# Patient Record
Sex: Female | Born: 1985 | Race: Black or African American | Hispanic: No | Marital: Single | Smoking: Current every day smoker
Health system: Southern US, Community
[De-identification: ages and names within clinical notes are randomized; demographics above are authoritative.]

## PROBLEM LIST (undated history)

## (undated) DIAGNOSIS — R079 Chest pain, unspecified: Secondary | ICD-10-CM

## (undated) DIAGNOSIS — F32A Depression, unspecified: Secondary | ICD-10-CM

## (undated) DIAGNOSIS — M549 Dorsalgia, unspecified: Secondary | ICD-10-CM

## (undated) DIAGNOSIS — J45909 Unspecified asthma, uncomplicated: Secondary | ICD-10-CM

## (undated) DIAGNOSIS — E559 Vitamin D deficiency, unspecified: Secondary | ICD-10-CM

## (undated) DIAGNOSIS — I10 Essential (primary) hypertension: Secondary | ICD-10-CM

## (undated) DIAGNOSIS — F419 Anxiety disorder, unspecified: Secondary | ICD-10-CM

## (undated) DIAGNOSIS — M1990 Unspecified osteoarthritis, unspecified site: Secondary | ICD-10-CM

## (undated) DIAGNOSIS — M2550 Pain in unspecified joint: Secondary | ICD-10-CM

## (undated) DIAGNOSIS — R0602 Shortness of breath: Secondary | ICD-10-CM

## (undated) HISTORY — DX: Unspecified osteoarthritis, unspecified site: M19.90

## (undated) HISTORY — DX: Morbid (severe) obesity due to excess calories: E66.01

## (undated) HISTORY — DX: Shortness of breath: R06.02

## (undated) HISTORY — PX: OTHER SURGICAL HISTORY: SHX169

## (undated) HISTORY — DX: Pain in unspecified joint: M25.50

## (undated) HISTORY — DX: Dorsalgia, unspecified: M54.9

## (undated) HISTORY — DX: Depression, unspecified: F32.A

## (undated) HISTORY — DX: Anxiety disorder, unspecified: F41.9

## (undated) HISTORY — DX: Chest pain, unspecified: R07.9

## (undated) HISTORY — DX: Vitamin D deficiency, unspecified: E55.9

---

## 2018-03-06 ENCOUNTER — Other Ambulatory Visit: Payer: Self-pay

## 2018-12-09 ENCOUNTER — Encounter (HOSPITAL_COMMUNITY): Payer: Self-pay

## 2018-12-09 ENCOUNTER — Emergency Department (HOSPITAL_COMMUNITY): Payer: BLUE CROSS/BLUE SHIELD

## 2018-12-09 ENCOUNTER — Other Ambulatory Visit: Payer: Self-pay

## 2018-12-09 ENCOUNTER — Emergency Department (HOSPITAL_COMMUNITY)
Admission: EM | Admit: 2018-12-09 | Discharge: 2018-12-09 | Disposition: A | Discharge status: Home / Self Care | Payer: BLUE CROSS/BLUE SHIELD | Attending: Emergency Medicine | Admitting: Emergency Medicine

## 2018-12-09 DIAGNOSIS — J45909 Unspecified asthma, uncomplicated: Secondary | ICD-10-CM | POA: Insufficient documentation

## 2018-12-09 DIAGNOSIS — F17210 Nicotine dependence, cigarettes, uncomplicated: Secondary | ICD-10-CM | POA: Insufficient documentation

## 2018-12-09 DIAGNOSIS — Z79899 Other long term (current) drug therapy: Secondary | ICD-10-CM | POA: Diagnosis not present

## 2018-12-09 DIAGNOSIS — M5442 Lumbago with sciatica, left side: Secondary | ICD-10-CM | POA: Insufficient documentation

## 2018-12-09 DIAGNOSIS — I10 Essential (primary) hypertension: Secondary | ICD-10-CM | POA: Insufficient documentation

## 2018-12-09 DIAGNOSIS — M545 Low back pain: Secondary | ICD-10-CM | POA: Diagnosis present

## 2018-12-09 HISTORY — DX: Unspecified asthma, uncomplicated: J45.909

## 2018-12-09 HISTORY — DX: Essential (primary) hypertension: I10

## 2018-12-09 LAB — BASIC METABOLIC PANEL
Anion gap: 8 (ref 5–15)
BUN: 8 mg/dL (ref 6–20)
CO2: 25 mmol/L (ref 22–32)
Calcium: 9.4 mg/dL (ref 8.9–10.3)
Chloride: 105 mmol/L (ref 98–111)
Creatinine, Ser: 0.88 mg/dL (ref 0.44–1.00)
GFR calc Af Amer: >60 mL/min (ref >60)
GFR calc non Af Amer: >60 mL/min (ref >60)
Glucose, Bld: 100 mg/dL — ABNORMAL HIGH (ref 70–99)
Potassium: 4.1 mmol/L (ref 3.5–5.1)
Sodium: 138 mmol/L (ref 135–145)

## 2018-12-09 LAB — CBC
HCT: 44 % (ref 36.0–46.0)
Hemoglobin: 14.1 g/dL (ref 12.0–15.0)
MCH: 29.2 pg (ref 26.0–34.0)
MCHC: 32 g/dL (ref 30.0–36.0)
MCV: 91.1 fL (ref 80.0–100.0)
Platelets: 238 10*3/uL (ref 150–400)
RBC: 4.83 MIL/uL (ref 3.87–5.11)
RDW: 12.4 % (ref 11.5–15.5)
WBC: 7.8 10*3/uL (ref 4.0–10.5)
nRBC: 0 % (ref 0.0–0.2)

## 2018-12-09 LAB — CBG MONITORING, ED: Glucose-Capillary: 107 mg/dL — ABNORMAL HIGH (ref 70–99)

## 2018-12-09 MED ORDER — LISINOPRIL 5 MG PO TABS: 5.0000 mg | ORAL_TABLET | Freq: Every day | ORAL | 0 refills | Status: DC

## 2018-12-09 MED ORDER — METHOCARBAMOL 500 MG PO TABS: 500.0000 mg | ORAL_TABLET | Freq: Three times a day (TID) | ORAL | 0 refills | Status: AC | PRN

## 2018-12-09 MED ORDER — PREDNISONE 20 MG PO TABS: 40.0000 mg | ORAL_TABLET | Freq: Every day | ORAL | 0 refills | Status: DC
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
## 2018-12-09 NOTE — Discharge Instructions (Signed)
Follow-up with a primary care doctor.  Hopefully the steroids will help with the back pain.  Tylenol and Motrin may also help.  Your blood pressure will need tighter management also.

## 2019-09-14 ENCOUNTER — Encounter: Payer: Self-pay | Admitting: Obstetrics & Gynecology

## 2019-09-14 ENCOUNTER — Other Ambulatory Visit: Payer: Self-pay

## 2019-09-14 ENCOUNTER — Other Ambulatory Visit (HOSPITAL_COMMUNITY)
Admission: RE | Admit: 2019-09-14 | Discharge: 2019-09-14 | Disposition: A | Discharge status: Home / Self Care | Payer: 59 | Source: Ambulatory Visit | Attending: Obstetrics & Gynecology | Admitting: Obstetrics & Gynecology

## 2019-09-14 ENCOUNTER — Ambulatory Visit (INDEPENDENT_AMBULATORY_CARE_PROVIDER_SITE_OTHER): Payer: 59 | Admitting: Obstetrics & Gynecology

## 2019-09-14 VITALS — BP 158/101 | HR 72 | Ht 70.0 in | Wt 332.1 lb

## 2019-09-14 DIAGNOSIS — Z01419 Encounter for gynecological examination (general) (routine) without abnormal findings: Secondary | ICD-10-CM | POA: Diagnosis present

## 2019-09-14 DIAGNOSIS — Z30431 Encounter for routine checking of intrauterine contraceptive device: Secondary | ICD-10-CM

## 2019-09-14 NOTE — Patient Instructions (Signed)
Preventive Care 21-33 Years Old, Female Preventive care refers to visits with your health care provider and lifestyle choices that can promote health and wellness. This includes:  A yearly physical exam. This may also be called an annual well check.  Regular dental visits and eye exams.  Immunizations.  Screening for certain conditions.  Healthy lifestyle choices, such as eating a healthy diet, getting regular exercise, not using drugs or products that contain nicotine and tobacco, and limiting alcohol use. What can I expect for my preventive care visit? Physical exam Your health care provider will check your:  Height and weight. This may be used to calculate body mass index (BMI), which tells if you are at a healthy weight.  Heart rate and blood pressure.  Skin for abnormal spots. Counseling Your health care provider may ask you questions about your:  Alcohol, tobacco, and drug use.  Emotional well-being.  Home and relationship well-being.  Sexual activity.  Eating habits.  Work and work environment.  Method of birth control.  Menstrual cycle.  Pregnancy history. What immunizations do I need?  Influenza (flu) vaccine  This is recommended every year. Tetanus, diphtheria, and pertussis (Tdap) vaccine  You may need a Td booster every 10 years. Varicella (chickenpox) vaccine  You may need this if you have not been vaccinated. Human papillomavirus (HPV) vaccine  If recommended by your health care provider, you may need three doses over 6 months. Measles, mumps, and rubella (MMR) vaccine  You may need at least one dose of MMR. You may also need a second dose. Meningococcal conjugate (MenACWY) vaccine  One dose is recommended if you are age 19-21 years and a first-year college student living in a residence hall, or if you have one of several medical conditions. You may also need additional booster doses. Pneumococcal conjugate (PCV13) vaccine  You may need  this if you have certain conditions and were not previously vaccinated. Pneumococcal polysaccharide (PPSV23) vaccine  You may need one or two doses if you smoke cigarettes or if you have certain conditions. Hepatitis A vaccine  You may need this if you have certain conditions or if you travel or work in places where you may be exposed to hepatitis A. Hepatitis B vaccine  You may need this if you have certain conditions or if you travel or work in places where you may be exposed to hepatitis B. Haemophilus influenzae type b (Hib) vaccine  You may need this if you have certain conditions. You may receive vaccines as individual doses or as more than one vaccine together in one shot (combination vaccines). Talk with your health care provider about the risks and benefits of combination vaccines. What tests do I need?  Blood tests  Lipid and cholesterol levels. These may be checked every 5 years starting at age 20.  Hepatitis C test.  Hepatitis B test. Screening  Diabetes screening. This is done by checking your blood sugar (glucose) after you have not eaten for a while (fasting).  Sexually transmitted disease (STD) testing.  BRCA-related cancer screening. This may be done if you have a family history of breast, ovarian, tubal, or peritoneal cancers.  Pelvic exam and Pap test. This may be done every 3 years starting at age 21. Starting at age 30, this may be done every 5 years if you have a Pap test in combination with an HPV test. Talk with your health care provider about your test results, treatment options, and if necessary, the need for more tests.   Follow these instructions at home: Eating and drinking   Eat a diet that includes fresh fruits and vegetables, whole grains, lean protein, and low-fat dairy.  Take vitamin and mineral supplements as recommended by your health care provider.  Do not drink alcohol if: ? Your health care provider tells you not to drink. ? You are  pregnant, may be pregnant, or are planning to become pregnant.  If you drink alcohol: ? Limit how much you have to 0-1 drink a day. ? Be aware of how much alcohol is in your drink. In the [place] one drink equals one 12 oz bottle of beer (355 mL), one 5 oz glass of wine (148 mL), or one 1 oz glass of hard liquor (44 mL). Lifestyle  Take daily care of your teeth and gums.  Stay active. Exercise for at least 30 minutes on 5 or more days each week.  Do not use any products that contain nicotine or tobacco, such as cigarettes, e-cigarettes, and chewing tobacco. If you need help quitting, ask your health care provider.  If you are sexually active, practice safe sex. Use a condom or other form of birth control (contraception) in order to prevent pregnancy and STIs (sexually transmitted infections). If you plan to become pregnant, see your health care provider for a preconception visit. What's next?  Visit your health care provider once a year for a well check visit.  Ask your health care provider how often you should have your eyes and teeth checked.  Stay up to date on all vaccines. This information is not intended to replace advice given to you by your health care provider. Make sure you discuss any questions you have with your health care provider. Document Revised: 09/30/2017 Document Reviewed: 09/30/2017 Elsevier Patient Education  2020 [employer].

## 2019-09-14 NOTE — Progress Notes (Signed)
GYNECOLOGY ANNUAL PREVENTATIVE CARE ENCOUNTER NOTE  History:     Stacy Stafford is a 34 y.o. female here for a routine annual gynecologic exam.  Current complaints: wants to make sure IUD is in place, also desires annual STI screen.   Denies abnormal vaginal bleeding, discharge, pelvic pain, problems with intercourse or other gynecologic concerns.    Gynecologic History No LMP recorded. (Menstrual status: IUD). Contraception: Mirena IUD placed 2017 Last Pap: 02/2018. Results were: normal with negative HPV  Past Medical History:  Diagnosis Date  . Asthma   . Hypertension     History reviewed. No pertinent surgical history.  Current Outpatient Medications on File Prior to Visit  Medication Sig Dispense Refill  . acetaminophen (TYLENOL) 500 MG tablet Take 500 mg by mouth every 6 (six) hours as needed.    Marland Kitchen ibuprofen (ADVIL) 100 MG/5ML suspension Take 200 mg by mouth every 4 (four) hours as needed.    Marland Kitchen amLODipine (NORVASC) 5 MG tablet Take 5 mg by mouth daily. (Patient not taking: Reported on 09/14/2019)    . lisinopril (ZESTRIL) 5 MG tablet Take 1 tablet (5 mg total) by mouth daily. (Patient not taking: Reported on 09/14/2019) 30 tablet 0  . methocarbamol (ROBAXIN) 500 MG tablet Take 1 tablet (500 mg total) by mouth every 8 (eight) hours as needed for muscle spasms. (Patient not taking: Reported on 09/14/2019) 10 tablet 0  . predniSONE (DELTASONE) 20 MG tablet Take 2 tablets (40 mg total) by mouth daily. (Patient not taking: Reported on 09/14/2019) 6 tablet 0   No current facility-administered medications on file prior to visit.    No Known Allergies  Social History:  reports that she has been smoking cigarettes. She has been smoking about 0.50 packs per day. She has never used smokeless tobacco. She reports current alcohol use of about 1.0 standard drink of alcohol per week. She reports that she does not use drugs.  Family History  Family history unknown: Yes    The following  portions of the patient's history were reviewed and updated as appropriate: allergies, current medications, past family history, past medical history, past social history, past surgical history and problem list.  Review of Systems Pertinent items noted in HPI and remainder of comprehensive ROS otherwise negative.  Physical Exam:  BP (!) 158/101   Pulse 72   Ht 5\' 10"  (1.778 m)   Wt (!) 332 lb 1.6 oz (150.6 kg)   BMI 47.65 kg/m  CONSTITUTIONAL: Well-developed, well-nourished female in no acute distress.  HENT:  Normocephalic, atraumatic, External right and left ear normal. Oropharynx is clear and moist EYES: Conjunctivae and EOM are normal. Pupils are equal, round, and reactive to light. No scleral icterus.  NECK: Normal range of motion, supple, no masses.  Normal thyroid.  SKIN: Skin is warm and dry. No rash noted. Not diaphoretic. No erythema. No pallor. MUSCULOSKELETAL: Normal range of motion. No tenderness.  No cyanosis, clubbing, or edema. NEUROLOGIC: Alert and oriented to person, place, and time. Normal reflexes, muscle tone coordination. No cranial nerve deficit noted. PSYCHIATRIC: Normal mood and affect. Normal behavior. Normal judgment and thought content. CARDIOVASCULAR: Normal heart rate noted, regular rhythm RESPIRATORY: Clear to auscultation bilaterally. Effort and breath sounds normal, no problems with respiration noted. BREASTS: Symmetric in size. No masses, tenderness, skin changes, nipple drainage, or lymphadenopathy bilaterally.  Performed in the presence of a chaperone. ABDOMEN: Soft, obese, no distention appreciated.  No tenderness, rebound or guarding.  PELVIC: Normal appearing external genitalia  and urethral meatus; normal appearing vaginal mucosa and cervix. IUD strings seen.  Normal appearing discharge.  Unable to palpate uterus or adnexa secondary to habitus.  Performed in the presence of a chaperone.    Assessment and Plan:    1. IUD check up IUD in place, also  noted to be in place during CT scan done in 01/2019 for other indications. Patient reassured. Due for removal in 2023.  2. Well woman exam with routine gynecological exam Up to date with pap, not done today. STI screening done, will follow up results and manage accordingly. - HIV antibody (with reflex) - Hepatitis C Antibody - Cervicovaginal ancillary only - Hepatitis B surface antigen - RPR Routine preventative health maintenance measures emphasized.  Told to follow up with PCP for HTN. Please refer to After Visit Summary for other counseling recommendations.      Jaynie Collins, MD, FACOG Obstetrician & Gynecologist, [facility] for [employer], [facility]

## 2019-09-15 LAB — CERVICOVAGINAL ANCILLARY ONLY
Chlamydia: NEGATIVE
Comment: NEGATIVE
Comment: NEGATIVE
Comment: NORMAL
Neisseria Gonorrhea: NEGATIVE
Trichomonas: NEGATIVE

## 2019-09-15 LAB — HEPATITIS B SURFACE ANTIGEN: Hepatitis B Surface Ag: NEGATIVE

## 2019-09-15 LAB — HIV ANTIBODY (ROUTINE TESTING W REFLEX): HIV Screen 4th Generation wRfx: NONREACTIVE

## 2019-09-15 LAB — HEPATITIS C ANTIBODY: Hep C Virus Ab: <0.1 s/co ratio (ref 0.0–0.9)

## 2019-09-15 LAB — RPR: RPR Ser Ql: NONREACTIVE

## 2019-09-25 ENCOUNTER — Other Ambulatory Visit: Payer: Self-pay | Admitting: Orthopedic Surgery

## 2019-09-25 DIAGNOSIS — M545 Low back pain, unspecified: Secondary | ICD-10-CM

## 2019-10-10 ENCOUNTER — Other Ambulatory Visit: Payer: Self-pay

## 2019-10-10 ENCOUNTER — Encounter (INDEPENDENT_AMBULATORY_CARE_PROVIDER_SITE_OTHER): Payer: Self-pay | Admitting: Family Medicine

## 2019-10-10 ENCOUNTER — Ambulatory Visit (INDEPENDENT_AMBULATORY_CARE_PROVIDER_SITE_OTHER): Payer: 59 | Admitting: Family Medicine

## 2019-10-10 ENCOUNTER — Encounter (INDEPENDENT_AMBULATORY_CARE_PROVIDER_SITE_OTHER): Payer: Self-pay | Admitting: Psychology

## 2019-10-10 VITALS — BP 128/91 | HR 87 | Temp 97.9°F | Ht 70.0 in | Wt 270.0 lb

## 2019-10-10 DIAGNOSIS — E559 Vitamin D deficiency, unspecified: Secondary | ICD-10-CM

## 2019-10-10 DIAGNOSIS — Z91199 Patient's noncompliance with other medical treatment and regimen due to unspecified reason: Secondary | ICD-10-CM

## 2019-10-10 DIAGNOSIS — Z6838 Body mass index (BMI) 38.0-38.9, adult: Secondary | ICD-10-CM

## 2019-10-10 DIAGNOSIS — F329 Major depressive disorder, single episode, unspecified: Secondary | ICD-10-CM

## 2019-10-10 DIAGNOSIS — Z8669 Personal history of other diseases of the nervous system and sense organs: Secondary | ICD-10-CM | POA: Insufficient documentation

## 2019-10-10 DIAGNOSIS — Z9119 Patient's noncompliance with other medical treatment and regimen: Secondary | ICD-10-CM

## 2019-10-10 DIAGNOSIS — R5383 Other fatigue: Secondary | ICD-10-CM | POA: Diagnosis not present

## 2019-10-10 DIAGNOSIS — I10 Essential (primary) hypertension: Secondary | ICD-10-CM | POA: Diagnosis not present

## 2019-10-10 DIAGNOSIS — Z9189 Other specified personal risk factors, not elsewhere classified: Secondary | ICD-10-CM | POA: Diagnosis not present

## 2019-10-10 DIAGNOSIS — Z0289 Encounter for other administrative examinations: Secondary | ICD-10-CM

## 2019-10-10 DIAGNOSIS — R0602 Shortness of breath: Secondary | ICD-10-CM

## 2019-10-10 DIAGNOSIS — F32A Depression, unspecified: Secondary | ICD-10-CM | POA: Insufficient documentation

## 2019-10-10 NOTE — Progress Notes (Addendum)
Dear Dr. Yevette Stafford,   Thank you for referring Stacy Stafford to our clinic. The following note includes my evaluation and treatment recommendations.   Chief Complaint:   This is the patient's first visit at [facility].  The patient's NEW PATIENT PACKET that they filled out prior to today's office visit was reviewed at length and some information from that paperwork was also included within the following office visit note.    Included in the packet: current and past health history, medications, allergies, ROS, gynecologic history (women only), surgical history, family history, social history, weight history, weight loss surgery history (for those that have had weight loss surgery), nutritional evaluation, mood and food questionnaire along with a depression screening (PHQ9) on all patients, an Epworth questionnaire, sleep habits questionnaire, patient life and health improvement goals questionnaire. These will all be scanned into the patient's chart under media.   During the visit, I independently reviewed the patient's EKG (which pt was unable to stay still during exam which interfered with quality of exam), bioimpedance scale results, and indirect calorimeter results. I used this information to tailor a meal plan for the patient that will help Stacy Stafford to lose weight and will improve her obesity-related conditions going forward. I performed a medically necessary appropriate examination and/or evaluation. I discussed the assessment and treatment plan with the patient. The patient was provided an opportunity to ask questions and all were answered. The patient agreed with the plan and demonstrated an understanding of the instructions. Labs were ordered at this visit and will be reviewed at the next visit unless more critical results need to be addressed immediately. Clinical information was updated and documented in the EMR.  Time spent on visit including pre-visit chart review and  post-visit care was estimated to be 60+ minutes.    OBESITY Stacy Stafford (MR# [id number]) is a 34 y.o. female who presents for evaluation and treatment of obesity and related comorbidities. Current BMI is Body mass index is 38.74 kg/m. Stacy Stafford has been struggling with her weight for many years and has been unsuccessful in either losing weight, maintaining weight loss, or reaching her healthy weight goal.  Stacy Stafford is currently in the action stage of change and ready to dedicate time achieving and maintaining a healthier weight. Stacy Stafford is interested in becoming our patient and working on intensive lifestyle modifications including (but not limited to) diet and exercise for weight loss.  Stacy Stafford sees Dr. [place] Case in Orthopedics at [facility], but she recently saw a new orthopedic doc for chronic OA/joint pain.  Unsure who started her on tramadol and meloxicam.  They did blood work then as well, but not sure of results.   - No PCP for several years. We discussed importance of her obtaining one in the near future today!  Stacy Stafford's habits were reviewed today and are as follows: Her family eats meals together, she thinks her family will eat healthier with her, her desired weight loss is 132 pounds, she has been heavy most of her life, her heaviest weight ever was 332 pounds, she craves meat, cheese, and popsicles (Icees), she snacks frequently in the evenings, she skips breakfast frequently, she is frequently drinking liquids with calories, she frequently makes poor food choices, she has problems with excessive hunger, she frequently eats larger portions than normal and she struggles with emotional eating.  Depression Screen Raven's Food and Mood (modified PHQ-9) score was 24.  Depression screen [facility] 2/9 10/10/2019  Decreased Interest 3  Down, Depressed,  Hopeless 3  PHQ - 2 Score 6  Altered sleeping 3  Tired, decreased energy 3  Change in appetite 3  Feeling bad or failure about yourself  0    Trouble concentrating 3  Moving slowly or fidgety/restless 3  Suicidal thoughts 0  PHQ-9 Score 21   Subjective:   1. Other fatigue Stacy Stafford admits to daytime somnolence and reports waking up still tired. Patent has a history of symptoms of daytime fatigue, morning fatigue, morning headache and snoring. Stacy Stafford generally gets 7 or 8 hours of sleep per night, and states that she has poor quality sleep. Snoring is present. Apneic episodes are present. Epworth Sleepiness Score is 12.  2. Shortness of breath on exertion Lavelle notes increasing shortness of breath with exercising and seems to be worsening over time with weight gain. She notes getting out of breath sooner with activity than she used to. This has gotten worse recently. Stacy Stafford denies shortness of breath at rest or orthopnea.  3. HTN, goal below 130/80-  ( per pt diet controlled currently ) Stacy Stafford says that, "all her life" she has been told it was high.  She had been on amlodipine in the past, but was lost to follow-up and has not gone back in several years to see a PCP.  She is not checking it at home - no way to do so.  BP Readings from Last 3 Encounters:  10/10/19 (!) 128/91  09/14/19 (!) 158/101  12/09/18 (!) 188/120   4. History of seizure disorder Stacy Stafford was seen by Neurology in the past.  Last time was a couple of years ago, but she did not need chronic medications.  She was lost to follow-up.  No seizures for 3-4 years now.  No medications.  No concerns about it.  5. H/O noncompliance with medical treatment, presenting hazards to health She has a history of significant medical noncompliance.  6. Vitamin D deficiency She is currently taking no vitamin D supplement.  She never went on a supplement due to cost and noncompliance.  7. Depression, unspecified depression type Stacy Stafford uses prayer and has supportive people in her life.  No SI and would never think to harm herself, she says.  She has never been on  medications for mood and states she refuses to take medicines.  8. At risk for activity intolerance Stacy Stafford is at risk for activity intolerance secondary to her significant chronic joint pains, diffuse and bilateral.  She is seeing Orthopedics for this as well.  This condition causes significant physical limitations for her.  She is in a wheelchair today.  Assessment/Plan:   1. Other fatigue Stacy Stafford does feel that her weight is causing her energy to be lower than it should be. Fatigue may be related to obesity, depression or many other causes. Labs will be ordered, and in the meanwhile, Stacy Stafford will focus on self care including making healthy food choices, increasing physical activity and focusing on stress reduction. - EKG 12-Lead - Vitamin B12 - CBC with Differential/Platelet - Lipid Panel With LDL/HDL Ratio - T3 - T4, free - TSH - VITAMIN D 25 Hydroxy (Vit-D Deficiency, Fractures) - Hemoglobin A1c - Folate - Comprehensive metabolic panel - LDL cholesterol, direct  2. Shortness of breath on exertion Stacy Stafford does feel that she gets out of breath more easily that she used to when she exercises. [name]'s shortness of breath appears to be obesity related and exercise induced. She has agreed to work on weight loss and gradually increase exercise to  treat her exercise induced shortness of breath. Will continue to monitor closely. - Vitamin B12 - CBC with Differential/Platelet - Lipid Panel With LDL/HDL Ratio - T3 - T4, free - TSH - VITAMIN D 25 Hydroxy (Vit-D Deficiency, Fractures) - Hemoglobin A1c - Folate - Comprehensive metabolic panel - LDL cholesterol, direct  3. HTN, goal below 130/80-  ( per pt diet controlled currently ) Blood pressure is not at goal.  She we will closely monitor here in the clinic.  Follow-up in 2 weeks, prudent nutritional plan, low salt diet encouraged, weight loss, check labs.  - T3 - T4, free - TSH - Comprehensive metabolic panel - LDL  cholesterol, direct  4. History of seizure disorder Will avoid certain medications due to history of seizures, but does not appear to be an active issue.  - Vitamin B12 - CBC with Differential/Platelet - T3 - T4, free - TSH - Hemoglobin A1c - Folate - Comprehensive metabolic panel  5. H/O noncompliance with medical treatment, presenting hazards to health Risks associated with noncompliant behavior with my treatment regimen or treatment plan reviewed with patient.   She has depression, denies significant barriers of education, but does have some financial barriers.  Explained many significant risks associated with these disease processes and that if they remain poorly controlled, can even lead to death.  Check labs as she is high risk for CV disease due to lifestyle and past medical history.  - Vitamin B12 - CBC with Differential/Platelet - Lipid Panel With LDL/HDL Ratio - T3 - T4, free - TSH - VITAMIN D 25 Hydroxy (Vit-D Deficiency, Fractures) - Hemoglobin A1c - Folate - Comprehensive metabolic panel - LDL cholesterol, direct  6. Vitamin D deficiency Will check vitamin D level today.  - VITAMIN D 25 Hydroxy (Vit-D Deficiency, Fractures)  7. Depression, unspecified depression type Patient was referred to Dr. Dewaine Conger, our Bariatric Psychologist, for evaluation due to her elevated PHQ-9 score and significant struggles with emotional eating.  Denies SI.  Declines medications.  Will check labs today.    Healthy lifestyle modifications discussed with her of sleep hygiene, trying to move more, prayer or meditation, and counseling, etc., in addition to healthy nutritional plan.  - Vitamin B12 - CBC with Differential/Platelet - T3 - T4, free - TSH - VITAMIN D 25 Hydroxy (Vit-D Deficiency, Fractures) - Hemoglobin A1c - Folate - Comprehensive metabolic panel  8. At risk for activity intolerance Megan was given approximately 12-15 minutes of exercise intolerance  counseling today. She is 34 y.o. female and has risk factors exercise intolerance including obesity, chronic joint pains etc. We discussed intensive lifestyle modifications today with an emphasis on specific weight loss instructions and strategies. [place] will slowly increase activity as tolerated.  9. Class 2 severe obesity with serious comorbidity and body mass index (BMI) of 38.0 to 38.9 in adult, unspecified obesity type (HCC) Mizani is currently in the action stage of change and her goal is to continue with weight loss efforts. I recommend Jalisia begin the structured treatment plan as follows:  She has agreed to the Category 3 Plan because she states she often skips meals and cannot eat that much food to be on a higher calorie diet/meal plan.  Exercise goals: All adults should avoid inactivity. Some physical activity is better than none, and adults who participate in any amount of physical activity gain some health benefits.   Behavioral modification strategies: increasing lean protein intake, decreasing liquid calories, no skipping meals, meal planning and cooking strategies and  planning for success.   Charnika is not fasting today (had regular Mt. Dew), so we will not do fasting insulin and will add direct LDL.    She needs to establish with a PCP.    She states she will go on MyChart and make an appointment with one in the very near future.   Evidence-based interventions for health behavior change were utilized today including the discussion of self monitoring techniques, & problem-solving barriers   Regarding patient's less desirable eating habits and patterns, we employed the technique of small changes.    She was informed of the importance of frequent follow-up visits to maximize her success with intensive lifestyle modifications for her multiple health conditions. She was informed we would discuss her lab results at her next visit unless there is a critical issue that needs  to be addressed sooner. Margherita agreed to keep her next visit at the agreed upon time to discuss these results.  Objective:   Blood pressure (!) 128/91, pulse 87, temperature 97.9 F (36.6 C), height 5\' 10"  (1.778 m), weight 270 lb (122.5 kg), SpO2 98 %. Body mass index is 38.74 kg/m.  EKG: Normal sinus rhythm, rate 84 bpm.  Indirect Calorimeter completed today shows a VO2 of 417 and a REE of 2901.   General: Cooperative, alert, well developed, in no acute distress. HEENT: Conjunctivae and lids unremarkable. Cardiovascular: Regular rhythm.  Lungs: Normal work of breathing. Neurologic: No focal deficits.   Lab Results  Component Value Date   CREATININE 0.88 12/09/2018   BUN 8 12/09/2018   NA 138 12/09/2018   K 4.1 12/09/2018   CL 105 12/09/2018   CO2 25 12/09/2018   Lab Results  Component Value Date   WBC 7.8 12/09/2018   HGB 14.1 12/09/2018   HCT 44.0 12/09/2018   MCV 91.1 12/09/2018   PLT 238 12/09/2018   Attestation Statements:   I, 13/07/2018, CMA, am acting as Insurance claims handler for Energy manager, DO.  I have reviewed the above documentation for accuracy and completeness, and I agree with the above. [employer], DO

## 2019-10-10 NOTE — Telephone Encounter (Signed)
Error
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
## 2019-10-11 ENCOUNTER — Telehealth (INDEPENDENT_AMBULATORY_CARE_PROVIDER_SITE_OTHER): Payer: 59 | Admitting: Psychology

## 2019-10-11 DIAGNOSIS — F3289 Other specified depressive episodes: Secondary | ICD-10-CM | POA: Diagnosis not present

## 2019-10-11 DIAGNOSIS — F419 Anxiety disorder, unspecified: Secondary | ICD-10-CM | POA: Diagnosis not present

## 2019-10-11 LAB — LDL CHOLESTEROL, DIRECT: LDL Direct: 153 mg/dL — ABNORMAL HIGH (ref 0–99)

## 2019-10-11 LAB — CBC WITH DIFFERENTIAL/PLATELET
Basophils Absolute: 0.1 10*3/uL (ref 0.0–0.2)
Basos: 1 %
EOS (ABSOLUTE): 0.3 10*3/uL (ref 0.0–0.4)
Eos: 5 %
Hematocrit: 46.2 % (ref 34.0–46.6)
Hemoglobin: 15 g/dL (ref 11.1–15.9)
Immature Grans (Abs): 0 10*3/uL (ref 0.0–0.1)
Immature Granulocytes: 0 %
Lymphocytes Absolute: 2.5 10*3/uL (ref 0.7–3.1)
Lymphs: 34 %
MCH: 29.5 pg (ref 26.6–33.0)
MCHC: 32.5 g/dL (ref 31.5–35.7)
MCV: 91 fL (ref 79–97)
Monocytes Absolute: 0.4 10*3/uL (ref 0.1–0.9)
Monocytes: 6 %
Neutrophils Absolute: 4 10*3/uL (ref 1.4–7.0)
Neutrophils: 54 %
Platelets: 231 10*3/uL (ref 150–450)
RBC: 5.09 x10E6/uL (ref 3.77–5.28)
RDW: 12.1 % (ref 11.7–15.4)
WBC: 7.4 10*3/uL (ref 3.4–10.8)

## 2019-10-11 LAB — COMPREHENSIVE METABOLIC PANEL
ALT: 18 IU/L (ref 0–32)
AST: 17 IU/L (ref 0–40)
Albumin/Globulin Ratio: 1.5 (ref 1.2–2.2)
Albumin: 4.2 g/dL (ref 3.8–4.8)
Alkaline Phosphatase: 69 IU/L (ref 48–121)
BUN/Creatinine Ratio: 16 (ref 9–23)
BUN: 15 mg/dL (ref 6–20)
Bilirubin Total: 0.3 mg/dL (ref 0.0–1.2)
CO2: 22 mmol/L (ref 20–29)
Calcium: 9.3 mg/dL (ref 8.7–10.2)
Chloride: 104 mmol/L (ref 96–106)
Creatinine, Ser: 0.92 mg/dL (ref 0.57–1.00)
GFR calc Af Amer: 95 mL/min/{1.73_m2} (ref >59)
GFR calc non Af Amer: 83 mL/min/{1.73_m2} (ref >59)
Globulin, Total: 2.8 g/dL (ref 1.5–4.5)
Glucose: 95 mg/dL (ref 65–99)
Potassium: 4.1 mmol/L (ref 3.5–5.2)
Sodium: 139 mmol/L (ref 134–144)
Total Protein: 7 g/dL (ref 6.0–8.5)

## 2019-10-11 LAB — VITAMIN B12: Vitamin B-12: 644 pg/mL (ref 232–1245)

## 2019-10-11 LAB — TSH: TSH: 1.12 u[IU]/mL (ref 0.450–4.500)

## 2019-10-11 LAB — T3: T3, Total: 129 ng/dL (ref 71–180)

## 2019-10-11 LAB — LIPID PANEL WITH LDL/HDL RATIO
Cholesterol, Total: 208 mg/dL — ABNORMAL HIGH (ref 100–199)
HDL: 37 mg/dL — ABNORMAL LOW (ref >39)
LDL Chol Calc (NIH): 157 mg/dL — ABNORMAL HIGH (ref 0–99)
LDL/HDL Ratio: 4.2 ratio — ABNORMAL HIGH (ref 0.0–3.2)
Triglycerides: 75 mg/dL (ref 0–149)
VLDL Cholesterol Cal: 14 mg/dL (ref 5–40)

## 2019-10-11 LAB — HEMOGLOBIN A1C
Est. average glucose Bld gHb Est-mCnc: 103 mg/dL
Hgb A1c MFr Bld: 5.2 % (ref 4.8–5.6)

## 2019-10-11 LAB — VITAMIN D 25 HYDROXY (VIT D DEFICIENCY, FRACTURES): Vit D, 25-Hydroxy: 8.5 ng/mL — ABNORMAL LOW (ref 30.0–100.0)

## 2019-10-11 LAB — FOLATE: Folate: 7.1 ng/mL (ref >3.0)

## 2019-10-11 LAB — T4, FREE: Free T4: 1.13 ng/dL (ref 0.82–1.77)

## 2019-10-11 NOTE — Progress Notes (Signed)
  Office: [phone]  /  Fax: [phone]    Date: October 25, 2019   Appointment Start Time: 8:04am Duration: 24 minutes Provider: Lawerance Cruel, Psy.D. Type of Session: Individual Therapy  Location of Patient: Home Location of Provider: Provider's Home Type of Contact: Telepsychological Visit via MyChart Video Visit  Session Content: This provider called Stacy Stafford at 8:02am as she did not present for the telepsychological appointment. It appears she forgot about today's appointment, but indicated she would be able to join. As such, today's appointment was initiated 4 minutes late. Stacy Stafford is a 34 y.o. female presenting for a follow-up appointment to address the previously established treatment goal of increasing coping skills. Today's appointment was a telepsychological visit due to COVID-19. Stacy Stafford provided verbal consent for today's telepsychological appointment and she is aware she is responsible for securing confidentiality on her end of the session. Prior to proceeding with today's appointment, Stacy Stafford's physical location at the time of this appointment was obtained as well a phone number she could be reached at in the event of technical difficulties. Stacy Stafford and this provider participated in today's telepsychological service.   This provider conducted a brief check-in. Stacy Stafford shared she is trying to "stick to" the structured meal plan. Reviewed physical and emotional hunger. She acknowledged an instance of emotional eating yesterday secondary to a conflict with her boyfriend. Psychoeducation regarding triggers for emotional eating was provided. Stacy Stafford was provided a handout, and encouraged to utilize the handout between now and the next appointment to increase awareness of triggers and frequency. Stacy Stafford agreed. This provider also discussed behavioral strategies for specific triggers, such as placing the utensil down when conversing to avoid mindless eating. Stacy Stafford provided  verbal consent during today's appointment for this provider to send a handout about triggers via e-mail. Stacy Stafford was receptive to today's appointment as evidenced by openness to sharing, responsiveness to feedback, and willingness to explore triggers for emotional eating.  Mental Status Examination:  Appearance: well groomed and appropriate hygiene  Behavior: appropriate to circumstances Mood: euthymic Affect: mood congruent Speech: normal in rate, volume, and tone Eye Contact: appropriate Psychomotor Activity: appropriate Gait: unable to assess Thought Process: linear, logical, and goal directed  Thought Content/Perception: no hallucinations, delusions, bizarre thinking or behavior reported or observed and no evidence of suicidal and homicidal ideation, plan, and intent Orientation: time, person, place, and purpose of appointment Memory/Concentration: memory, attention, language, and fund of knowledge intact  Insight/Judgment: fair  Interventions:  Conducted a brief chart review Provided empathic reflections and validation Reviewed content from the previous session Employed supportive psychotherapy interventions to facilitate reduced distress and to improve coping skills with identified stressors Psychoeducation provided regarding triggers for emotional eating  DSM-5 Diagnosis(es): 311 (F32.8) Other Specified Depressive Disorder, Emotional Eating Behaviors and 300.00 (F41.9) Unspecified Anxiety Disorder  Treatment Goal & Progress: During the initial appointment with this provider, the following treatment goal was established: increase coping skills. Stacy Stafford has demonstrated progress in her goal as evidenced by increased awareness of hunger patterns.   Plan: The next appointment will be scheduled in two weeks, which will be via MyChart Video Visit. The next session will focus on working towards the established treatment goal. Additionally, Stacy Stafford indicated she did not receive  paperwork from [facility] as noted in the referral notes. As such, she was encouraged to call them as soon as possible; she stated she will call today.

## 2019-10-20 ENCOUNTER — Other Ambulatory Visit: Payer: Self-pay

## 2019-10-20 ENCOUNTER — Ambulatory Visit
Admission: RE | Admit: 2019-10-20 | Discharge: 2019-10-20 | Disposition: A | Discharge status: Home / Self Care | Payer: 59 | Source: Ambulatory Visit | Attending: Orthopedic Surgery | Admitting: Orthopedic Surgery

## 2019-10-20 DIAGNOSIS — M545 Low back pain, unspecified: Secondary | ICD-10-CM

## 2019-10-24 ENCOUNTER — Other Ambulatory Visit: Payer: Self-pay

## 2019-10-24 ENCOUNTER — Encounter (INDEPENDENT_AMBULATORY_CARE_PROVIDER_SITE_OTHER): Payer: Self-pay | Admitting: Family Medicine

## 2019-10-24 ENCOUNTER — Ambulatory Visit (INDEPENDENT_AMBULATORY_CARE_PROVIDER_SITE_OTHER): Payer: 59 | Admitting: Family Medicine

## 2019-10-24 VITALS — BP 160/100 | HR 74 | Temp 98.5°F | Ht 70.0 in | Wt 316.0 lb

## 2019-10-24 DIAGNOSIS — E559 Vitamin D deficiency, unspecified: Secondary | ICD-10-CM

## 2019-10-24 DIAGNOSIS — Z6841 Body Mass Index (BMI) 40.0 and over, adult: Secondary | ICD-10-CM

## 2019-10-24 DIAGNOSIS — R739 Hyperglycemia, unspecified: Secondary | ICD-10-CM | POA: Diagnosis not present

## 2019-10-24 DIAGNOSIS — Z9189 Other specified personal risk factors, not elsewhere classified: Secondary | ICD-10-CM

## 2019-10-24 DIAGNOSIS — I10 Essential (primary) hypertension: Secondary | ICD-10-CM

## 2019-10-24 MED ORDER — VITAMIN D (ERGOCALCIFEROL) 1.25 MG (50000 UNIT) PO CAPS: ORAL_CAPSULE | ORAL | 0 refills | Status: DC

## 2019-10-25 ENCOUNTER — Telehealth (INDEPENDENT_AMBULATORY_CARE_PROVIDER_SITE_OTHER): Payer: 59 | Admitting: Psychology

## 2019-10-25 DIAGNOSIS — F419 Anxiety disorder, unspecified: Secondary | ICD-10-CM

## 2019-10-25 DIAGNOSIS — F3289 Other specified depressive episodes: Secondary | ICD-10-CM | POA: Diagnosis not present

## 2019-10-25 LAB — INSULIN, RANDOM: INSULIN: 19.2 u[IU]/mL (ref 2.6–24.9)

## 2019-10-25 NOTE — Progress Notes (Unsigned)
Office: [phone]  /  Fax: [phone]    Date: November 08, 2019   Appointment Start Time: *** Duration: *** minutes Provider: Lawerance Cruel, Psy.D. Type of Session: Individual Therapy  Location of Patient: {gbptloc:23249} Location of Provider: Provider's Home Type of Contact: Telepsychological Visit via MyChart Video Visit  Session Content: This provider called [name] at 8:03am as she did not present for the telepsychological appointment. A HIPAA compliant voicemail was left requesting a call back. As such, today's appointment was initiated *** minutes late.  Stacy Stafford is a 34 y.o. female presenting for a follow-up appointment to address the previously established treatment goal of increasing coping skills. Today's appointment was a telepsychological visit due to COVID-19. [name] provided verbal consent for today's telepsychological appointment and she is aware she is responsible for securing confidentiality on her end of the session. Prior to proceeding with today's appointment, [name]'s physical location at the time of this appointment was obtained as well a phone number she could be reached at in the event of technical difficulties. Bexley and this provider participated in today's telepsychological service.   This provider conducted a brief check-in and verbally administered the PHQ-9 and GAD-7. ***Reviewed triggers for emotional eating. Psychoeducation regarding mindfulness was provided. A handout was provided to [name] with further information regarding mindfulness, including exercises. This provider also explained the benefit of mindfulness as it relates to emotional eating. [name] was encouraged to engage in the provided exercises between now and the next appointment with this provider. [name] agreed. During today's appointment, [name] was led through a mindfulness exercise involving her senses. [name] provided verbal consent during today's appointment for this provider  to send *** via e-mail. Notably, this provider discussed her upcoming maternity leave toward the end of November. [name] acknowledged understanding given the uncertain nature of the circumstances, this provider may be out of the office sooner. This provider and [name] discussed referral options and verbal consent was provided for this provider to send a list of referral options via e-mail. All questions/concerns were addressed. [name] denied any concerns. Overall, [name] was receptive to today's appointment as evidenced by openness to sharing, responsiveness to feedback, and {gbreceptiveness:23401}.  Mental Status Examination:  Appearance: {Appearance:22431} Behavior: {Behavior:22445} Mood: {gbmood:21757} Affect: {Affect:22436} Speech: {Speech:22432} Eye Contact: {Eye Contact:22433} Psychomotor Activity: {Motor Activity:22434} Gait: {gbgait:23404} Thought Process: {thought process:22448}  Thought Content/Perception: {disturbances:22451} Orientation: {Orientation:22437} Memory/Concentration: {gbcognition:22449} Insight/Judgment: {Insight:22446}  Structured Assessments Results: The Patient Health Questionnaire-9 (PHQ-9) is a self-report measure that assesses symptoms and severity of depression over the course of the last two weeks. [name] obtained a score of *** suggesting {GBPHQ9SEVERITY:21752}. [name] finds the endorsed symptoms to be {gbphq9difficulty:21754}. [0= Not at all; 1= Several days; 2= More than half the days; 3= Nearly every day] Little interest or pleasure in doing things ***  Feeling down, depressed, or hopeless ***  Trouble falling or staying asleep, or sleeping too much ***  Feeling tired or having little energy ***  Poor appetite or overeating ***  Feeling bad about yourself --- or that you are a failure or have let yourself or your family down ***  Trouble concentrating on things, such as reading the newspaper or watching television ***  Moving or speaking so  slowly that other people could have noticed? Or the opposite --- being so fidgety or restless that you have been moving around a lot more than usual ***  Thoughts that you would be better off dead or hurting yourself in some way ***  PHQ-9 Score ***  The Generalized Anxiety Disorder-7 (GAD-7) is a brief self-report measure that assesses symptoms of anxiety over the course of the last two weeks. [name] obtained a score of *** suggesting {gbgad7severity:21753}. [name] finds the endorsed symptoms to be {gbphq9difficulty:21754}. [0= Not at all; 1= Several days; 2= Over half the days; 3= Nearly every day] Feeling nervous, anxious, on edge ***  Not being able to stop or control worrying ***  Worrying too much about different things ***  Trouble relaxing ***  Being so restless that it's hard to sit still ***  Becoming easily annoyed or irritable ***  Feeling afraid as if something awful might happen ***  GAD-7 Score ***   Interventions:  {Interventions for Progress Notes:23405}  DSM-5 Diagnosis(es): 311 (F32.8) Other Specified Depressive Disorder, Emotional Eating Behaviors and 300.00 (F41.9) Unspecified Anxiety Disorder  Treatment Goal & Progress: During the initial appointment with this provider, the following treatment goal was established: increase coping skills. [name] has demonstrated progress in her goal as evidenced by {gbtxprogress:22839}. [name] also {gbtxprogress2:22951}.  Plan: The next appointment will be scheduled in {gbweeks:21758}, which will be {gbtxmodality:23402}. The next session will focus on {Plan for Next Appointment:23400}.

## 2019-10-26 NOTE — Progress Notes (Signed)
Chief Complaint:   OBESITY Stacy Stafford is here to discuss her progress with her obesity treatment plan along with follow-up of her obesity related diagnoses. Stacy Stafford is on the Category 3 Plan and states she is following her eating plan approximately 80% of the time. Stacy Stafford states she is exercising for 0 minutes 0 times per week.  Today's visit was #: 2 Starting weight: 270 lbs Starting date: 10/10/2019 Today's weight: 316 lbs Today's date: 10/24/2019 Total lbs lost to date: 0 Total lbs lost since last in-office visit: 0  Interim History: Stacy Stafford says that over the first week, she still did not have the foods in the house.  She has limited mobility as well.  NOT Weighing foods - sandwich is "large".   ----> At her first office visit, she was in a wheelchair and today she is not, thus there are major discrepancies in weight and CMA who inputs the data and does the weighing is not sure why.   --> Today is the most accurate as she was able to stand and walk ( not in [place]) , so from here forward, will follow.   Subjective:   1. Essential hypertension Review: taking medications as instructed, no medication side effects noted, no chest pain on exertion, no dyspnea on exertion, no swelling of ankles.  She is on lisinopril and Norvasc.  Blood pressure is not at goal.  She is not checking it at home.  Doe snot have a monitor.  Asymptomatic.  Denies concerns with medications.  BP Readings from Last 3 Encounters:  10/24/19 (!) 160/100  10/10/19 (!) 128/91  09/14/19 (!) 158/101   2. Hyperglycemia Stacy Stafford has a history of some elevated blood glucose readings without a diagnosis of diabetes. She denies polyphagia.  History of elevated blood sugar 10 months ago.  Endorses sweets/carb cravings.  Lab Results  Component Value Date   ALT 18 10/10/2019   AST 17 10/10/2019   ALKPHOS 69 10/10/2019   BILITOT 0.3 10/10/2019   Lab Results  Component Value Date   CHOL 208 (H) 10/10/2019   HDL 37  (L) 10/10/2019   LDLCALC 157 (H) 10/10/2019   LDLDIRECT 153 (H) 10/10/2019   TRIG 75 10/10/2019   3. Vitamin D deficiency Stacy Stafford's Vitamin D level was 8.5 on 10/10/2019. She is currently taking no vitamin D supplement due to cost.  She denies nausea, vomiting or muscle weakness.  Positive for fatigue.    4. At risk for osteoporosis Stacy Stafford was given approximately 16 minutes of osteoporosis prevention counseling today.   Stacy Stafford is at risk for osteopenia and osteoporosis due to Vitamin D deficiency, as well as other risk factors.  We discussed the importance of prudent screenings through her PCP's office for prevention.     Stacy Stafford was encouraged to take her Vitamin D and follow her calcium rich diet.  We will continue to monitor vitamin D levels to ensure treatment is appropriate.   It is recommended that she eventually engage in weight bearing exercises and muscle strengthening exercises to help improve bone density and decrease her risk of osteopenia and osteoporosis.  Assessment/Plan:   1. Essential hypertension Worsening.  Discussed labs with patient today.  Miu is working on healthy weight loss and exercise to improve blood pressure control. We will watch for signs of hypotension as she continues her lifestyle modifications.  -  Poorly controlled today last ov was 128/91.  -  No home blood pressure monitor, which I highly encourage her to purchase  today!   - engage in Weight loss, prudent nutritional plan, closely follow. - Needs to establish with a PCP as well- asked her to call for appt   2. Hyperglycemia Discussed labs with patient today.  Fasting labs will be obtained and results with be discussed with Stacy Stafford in 2 weeks at her follow up visit. In the meanwhile Stacy Stafford was started on a lower simple carbohydrate diet and will work on weight loss efforts.  Check insulin level today.  She is fasting.  - Insulin, random   3. Vitamin D deficiency Discussed labs with patient  today.  Low Vitamin D level contributes to fatigue and are associated with obesity, breast, and colon cancer. She agrees to start to take prescription Vitamin D @50 ,000 IU every week and will follow-up for routine testing of Vitamin D, at least 2-3 times per year to avoid over-replacement.  Rx sent to pharmacy.  Counseling done.  Weight loss and prudent nutritional plan.  4. At risk for osteoporosis Stacy Stafford was given approximately 16 minutes of osteoporosis prevention counseling today.   Stacy Stafford is at risk for osteopenia and osteoporosis due to Vitamin D deficiency, as well as other risk factors.  We discussed the importance of prudent screenings through her PCP's office for prevention.     Stacy Stafford was encouraged to take her Vitamin D and follow her calcium rich diet.  We will continue to monitor vitamin D levels to ensure treatment is appropriate.   It is recommended that she eventually engage in weight bearing exercises and muscle strengthening exercises to help improve bone density and decrease her risk of osteopenia and osteoporosis.  5. Class 3 severe obesity with serious comorbidity and body mass index (BMI) of 45.0 to 49.9 in adult, unspecified obesity type (HCC)  Stacy Stafford is currently in the action stage of change. As such, her goal is to continue with weight loss efforts. She has agreed to the Category 3 Plan.   Exercise goals: As is.  Behavioral modification strategies: increasing lean protein intake, decreasing simple carbohydrates, decreasing eating out, meal planning and cooking strategies, keeping healthy foods in the home and planning for success.  Stacy Stafford has agreed to follow-up with our clinic in 2 weeks. She was informed of the importance of frequent follow-up visits to maximize her success with intensive lifestyle modifications for her multiple health conditions.   Stacy Stafford was informed we would discuss her lab results at her next visit unless there is a critical issue that  needs to be addressed sooner. Stacy Stafford agreed to keep her next visit at the agreed upon time to discuss these results.  Objective:   Blood pressure (!) 160/100, pulse 74, temperature 98.5 F (36.9 C), height 5\' 10"  (1.778 m), weight (!) 316 lb (143.3 kg), SpO2 99 %. Body mass index is 45.34 kg/m.  General: Cooperative, alert, well developed, in no acute distress. HEENT: Conjunctivae and lids unremarkable. Cardiovascular: Regular rhythm.  Lungs: Normal work of breathing. Neurologic: No focal deficits.   Lab Results  Component Value Date   CREATININE 0.92 10/10/2019   BUN 15 10/10/2019   NA 139 10/10/2019   K 4.1 10/10/2019   CL 104 10/10/2019   CO2 22 10/10/2019   Lab Results  Component Value Date   ALT 18 10/10/2019   AST 17 10/10/2019   ALKPHOS 69 10/10/2019   BILITOT 0.3 10/10/2019   Lab Results  Component Value Date   HGBA1C 5.2 10/10/2019   Lab Results  Component Value Date   INSULIN 19.2  10/24/2019   Lab Results  Component Value Date   TSH 1.120 10/10/2019   Lab Results  Component Value Date   CHOL 208 (H) 10/10/2019   HDL 37 (L) 10/10/2019   LDLCALC 157 (H) 10/10/2019   LDLDIRECT 153 (H) 10/10/2019   TRIG 75 10/10/2019   Lab Results  Component Value Date   WBC 7.4 10/10/2019   HGB 15.0 10/10/2019   HCT 46.2 10/10/2019   MCV 91 10/10/2019   PLT 231 10/10/2019   Attestation Statements:   Reviewed by clinician on day of visit: allergies, medications, problem list, medical history, surgical history, family history, social history, and previous encounter notes.  I, Insurance claims handler, CMA, am acting as Energy manager for [employer], DO.  I have reviewed the above documentation for accuracy and completeness, and I agree with the above. Thomasene Lot, DO

## 2019-11-06 ENCOUNTER — Encounter (INDEPENDENT_AMBULATORY_CARE_PROVIDER_SITE_OTHER): Payer: Self-pay

## 2019-11-07 ENCOUNTER — Ambulatory Visit (INDEPENDENT_AMBULATORY_CARE_PROVIDER_SITE_OTHER): Payer: 59 | Admitting: Family Medicine

## 2019-11-07 ENCOUNTER — Encounter (INDEPENDENT_AMBULATORY_CARE_PROVIDER_SITE_OTHER): Payer: Self-pay | Admitting: Family Medicine

## 2019-11-07 ENCOUNTER — Other Ambulatory Visit: Payer: Self-pay

## 2019-11-07 VITALS — BP 155/94 | HR 82 | Temp 98.4°F | Ht 70.0 in | Wt 306.0 lb

## 2019-11-07 DIAGNOSIS — I10 Essential (primary) hypertension: Secondary | ICD-10-CM

## 2019-11-07 DIAGNOSIS — Z6841 Body Mass Index (BMI) 40.0 and over, adult: Secondary | ICD-10-CM

## 2019-11-07 DIAGNOSIS — F3289 Other specified depressive episodes: Secondary | ICD-10-CM

## 2019-11-07 DIAGNOSIS — G629 Polyneuropathy, unspecified: Secondary | ICD-10-CM | POA: Diagnosis not present

## 2019-11-07 DIAGNOSIS — Z9189 Other specified personal risk factors, not elsewhere classified: Secondary | ICD-10-CM

## 2019-11-07 DIAGNOSIS — E559 Vitamin D deficiency, unspecified: Secondary | ICD-10-CM | POA: Diagnosis not present

## 2019-11-07 MED ORDER — VITAMIN D (ERGOCALCIFEROL) 1.25 MG (50000 UNIT) PO CAPS: ORAL_CAPSULE | ORAL | 0 refills | Status: AC

## 2019-11-08 ENCOUNTER — Other Ambulatory Visit: Payer: Self-pay | Admitting: Physical Medicine and Rehabilitation

## 2019-11-08 ENCOUNTER — Telehealth (INDEPENDENT_AMBULATORY_CARE_PROVIDER_SITE_OTHER): Payer: 59 | Admitting: Psychology

## 2019-11-08 ENCOUNTER — Telehealth (INDEPENDENT_AMBULATORY_CARE_PROVIDER_SITE_OTHER): Payer: Self-pay | Admitting: Psychology

## 2019-11-08 DIAGNOSIS — G8929 Other chronic pain: Secondary | ICD-10-CM

## 2019-11-08 DIAGNOSIS — M545 Low back pain, unspecified: Secondary | ICD-10-CM

## 2019-11-08 NOTE — Progress Notes (Signed)
Chief Complaint:   OBESITY Stacy Stafford is here to discuss her progress with her obesity treatment plan along with follow-up of her obesity related diagnoses. Stacy Stafford is on the Category 3 Plan and states she is following her eating plan approximately 50-60% of the time. Stacy Stafford states she is walking for 0 minutes 0 times per week.  Today's visit was #: 3 Starting weight: 270 lbs Starting date: 10/10/2019 Today's weight: 306 lbs Today's date: 11/07/2019 Total lbs lost to date: +36 pounds Total lbs lost since last in-office visit: 10 lbs  Interim History:   Pt appears much more energetic then usual.  She is ambulating w/ minimal difficulty  Stacy Stafford ate more on plan over the past 2 weeks.    She ate breakfast more often than skipping it.    For lunch, microwaveable meals.    She ate at [facility] once and had a foot long for an entire day.    Last night, she had pizza and one other night ate out, but that was it.  For dinner, chicken wing dings in the air fryer, salmon, cod, shrimp.    She established with a new primary care at [facility].    Assessment/Plan:   1. Essential hypertension Review: taking medications as instructed, no medication side effects noted, no chest pain on exertion, no dyspnea on exertion, no swelling of ankles.  Seen by PCP, whom she established with on September 29.  Given Lotrel.  She is taking it daily and tolerating it well.    Stacy Stafford is working on healthy weight loss and exercise to improve blood pressure control. We will watch for signs of hypotension as she continues her lifestyle modifications.    Rec monitoring at home.  Encouraged her to purchase Omron 3.  Printed it off for her.  It is $29.99 on [employer].    Continue daily medication- lotrel.    discussed with her that her daily goal is 130/80 or less.   Continue meal plan and weight loss.  BP Readings from Last 3 Encounters:  11/07/19 (!) 155/94  10/24/19 (!) 160/100    10/10/19 (!) 128/91    2. Neuropathy She is taking Cymbalta and having some nausea with it.  Her PCP started her on 20 mg twice daily.  She gets nauseous 35-40 minutes after taking it.  Plan: Take Cymbalta just once daily for 3-4 days and then, if tolerating well, go to twice daily as written.  Will consider changing dose at a later time as needed.  Drug counseling done.  Importance of adherence to treatment plan discussed with her.    3. Other depression, with emotional eating She was started on Cymbalta by her PCP at the end of September.  She says she is feeling better emotionally.  More energy.  Moving more.  Denies SI.  Plan: Will monitor her alongside PCP.  Due to nausea secondary to medication, eat first and then take medication and change to daily instead of twice daily for a couple of days to help with tolerance and then twice daily as written.  Will follow.   4. Vitamin D deficiency Lab Results  Component Value Date   VD25OH 8.5 (L) 10/10/2019   CALCIUM 9.3 10/10/2019   CALCIUM 9.4 12/09/2018   She was last seen for vitamin D deficiency approx 2 weeks ago.  Management since that visit includes Ergocal She reports excellent compliance with treatment. She is not having side effects.   Symptoms: Yes change in  energy level Yes numbness or tingling  Yes bone pain No unexplained fracture    PLAN:  - Discussed importance of vitamin D (as well as calcium) to their health and well-being.   - We reviewed possible symptoms of low Vitamin D including low energy, depressed mood, muscle aches, joint aches, osteoporosis etc.  - We discussed that low Vitamin D levels may be linked to an increased risk of cardiovascular events and even increased risk of cancers- such as colon and breast.   - I recommend pt take a weekly prescription vit D- see script below- which pt agrees to after discussion of risks and benefits of this medication.     - Informed patient this may be a lifelong  thing, and we will need to monitor levels regularly to keep within normal limits.   - Educated pt that weight loss will likely improve availability of vitamin D, thus encouraged Stacy Stafford to continue with meal plan and their weight loss efforts to further improve this condition  Meds ordered this encounter  Medications  . Vitamin D, Ergocalciferol, (DRISDOL) 1.25 MG (50000 UNIT) CAPS capsule    Sig: Take one tablet wkly    Dispense:  4 capsule    Refill:  0      5. At risk for heart disease Due to Stacy Stafford's current state of health and medical condition(s), she is at a higher risk for heart disease.   This puts the patient at much greater risk to subsequently develop cardiopulmonary conditions that can significantly affect patient's quality of life in a negative manner as well.    At least 11 minutes was spent on counseling Stacy Stafford about these concerns today and I stressed the importance of reversing risks factors of obesity, esp truncal and visceral fat, hypertension, hyperlipidemia, pre-diabetes.   Initial goal is to lose at least 5-10% of starting weight to help reduce these risk factors.   Counseling: Intensive lifestyle modifications discussed with Stacy Stafford as most appropriate first line treatment.  she will continue to work on diet, exercise and weight loss efforts.  We will continue to reassess these conditions on a fairly regular basis in an attempt to decrease patient's overall morbidity and mortality.  Evidence-based interventions for health behavior change were utilized today including the discussion of self monitoring techniques, problem-solving barriers and SMART goal setting techniques.  Specifically regarding patient's less desirable eating habits and patterns, we employed the technique of small changes when Stacy Stafford has not been able to fully commit to her prudent nutritional plan.   6. Class 3 severe obesity with serious comorbidity and body mass index (BMI) of 45.0 to 49.9 in  adult, unspecified obesity type (HCC)  Stacy Stafford is currently in the action stage of change. As such, her goal is to continue with weight loss efforts. She has agreed to the Category 3 Plan.   Exercise goals: As is.  Behavioral modification strategies: decreasing simple carbohydrates, decreasing eating out, meal planning and cooking strategies, avoiding temptations, planning for success and decreasing junk food.  Stacy Stafford has agreed to follow-up with our clinic in 2 weeks. She was informed of the importance of frequent follow-up visits to maximize her success with intensive lifestyle modifications for her multiple health conditions.     Objective:   Blood pressure (!) 155/94, pulse 82, temperature 98.4 F (36.9 C), height 5\' 10"  (1.778 m), weight (!) 306 lb (138.8 kg), SpO2 100 %. Body mass index is 43.91 kg/m.  General: Cooperative, alert, well developed, in no acute distress.  HEENT: Conjunctivae and lids unremarkable. Cardiovascular: Regular rhythm.  Lungs: Normal work of breathing. Neurologic: No focal deficits.   Lab Results  Component Value Date   CREATININE 0.92 10/10/2019   BUN 15 10/10/2019   NA 139 10/10/2019   K 4.1 10/10/2019   CL 104 10/10/2019   CO2 22 10/10/2019   Lab Results  Component Value Date   ALT 18 10/10/2019   AST 17 10/10/2019   ALKPHOS 69 10/10/2019   BILITOT 0.3 10/10/2019   Lab Results  Component Value Date   HGBA1C 5.2 10/10/2019   Lab Results  Component Value Date   INSULIN 19.2 10/24/2019   Lab Results  Component Value Date   TSH 1.120 10/10/2019   Lab Results  Component Value Date   CHOL 208 (H) 10/10/2019   HDL 37 (L) 10/10/2019   LDLCALC 157 (H) 10/10/2019   LDLDIRECT 153 (H) 10/10/2019   TRIG 75 10/10/2019   Lab Results  Component Value Date   WBC 7.4 10/10/2019   HGB 15.0 10/10/2019   HCT 46.2 10/10/2019   MCV 91 10/10/2019   PLT 231 10/10/2019     Attestation Statements:   Reviewed by clinician on day of  visit: allergies, medications, problem list, medical history, surgical history, family history, social history, and previous encounter notes.  I, Insurance claims handler, CMA, am acting as Energy manager for [employer], DO.  I have reviewed the above documentation for accuracy and completeness, and I agree with the above. Thomasene Lot, DO

## 2019-11-08 NOTE — Telephone Encounter (Signed)
  Office: [phone]  /  Fax: [phone]  Date of Call: November 08, 2019  Time of Call: 8:03am Provider: Lawerance Cruel, PsyD  CONTENT: This provider called Stacy Stafford to check-in as she did not present for today's MyChart Video Visit appointment at 8:00am. A HIPAA compliant voicemail was left requesting a call back. Of note, this provider stayed on the MyChart Video Visit appointment for 5 minutes prior to signing off per the clinic's grace period policy.    PLAN: This provider will wait for Stacy Stafford to call back. No further follow-up planned by this provider.

## 2019-11-12 DIAGNOSIS — G629 Polyneuropathy, unspecified: Secondary | ICD-10-CM | POA: Insufficient documentation

## 2019-11-16 ENCOUNTER — Inpatient Hospital Stay: Admission: RE | Admit: 2019-11-16 | Payer: 59 | Source: Ambulatory Visit

## 2019-11-22 ENCOUNTER — Ambulatory Visit (INDEPENDENT_AMBULATORY_CARE_PROVIDER_SITE_OTHER): Payer: 59 | Admitting: Family Medicine

## 2019-11-29 ENCOUNTER — Other Ambulatory Visit: Payer: Self-pay

## 2019-11-29 ENCOUNTER — Ambulatory Visit (INDEPENDENT_AMBULATORY_CARE_PROVIDER_SITE_OTHER): Payer: 59 | Admitting: Family Medicine

## 2019-11-29 ENCOUNTER — Encounter (INDEPENDENT_AMBULATORY_CARE_PROVIDER_SITE_OTHER): Payer: Self-pay | Admitting: Family Medicine

## 2019-11-29 VITALS — BP 182/98 | HR 101 | Temp 98.6°F | Ht 70.0 in | Wt 304.0 lb

## 2019-11-29 DIAGNOSIS — I10 Essential (primary) hypertension: Secondary | ICD-10-CM | POA: Diagnosis not present

## 2019-11-29 DIAGNOSIS — E559 Vitamin D deficiency, unspecified: Secondary | ICD-10-CM | POA: Diagnosis not present

## 2019-11-30 NOTE — Progress Notes (Signed)
Chief Complaint:   OBESITY Stacy Stafford is here to discuss her progress with her obesity treatment plan along with follow-up of her obesity related diagnoses. Stacy Stafford is on the Category 3 Plan and states she is following her eating plan approximately 30% of the time. Stacy Stafford states she is exercising for 0 minutes 0 times per week.  Today's visit was #: 4 Starting weight: 270 lbs Starting date: 10/10/2019 Today's weight: 304 lbs Today's date: 11/29/2019 Total lbs lost to date: +34 lbs Total lbs lost since last in-office visit: 2 lbs  Interim History: Stacy Stafford says that over the last 2 weeks she ate more foods on the meal plan.  She had more snacks, etc., but has been making better choices.  She is eating out 2-3 days per week on average.  She also had company at her house for a week and was having wings, pizza, [place], subs, etc. at times when they order [employer].  She knows she needs to meal prep a lot more.  Assessment/Plan:   1. Essential hypertension Review: taking medications as instructed, no medication side effects noted, no chest pain on exertion, no dyspnea on exertion, no swelling of ankles.  She has not taken her blood pressure medication yet today.  Blood pressure at home - not checking as she has not bought a cuff yet (recommended Omron Series 3 in the past).  Plan:  Discussed with her the importance of blood pressure control of 130/80 or less and detriments to her body if blood pressure is not well controlled.  Bring in blood pressure log to next office visit and if not at goal, will need a change in medications.  Recommend she decrease cigarettes/day as she hopes to eventually quit.  Decrease caffeine, decrease salt, increase water.  BP Readings from Last 3 Encounters:  11/29/19 (!) 182/98  11/07/19 (!) 155/94  10/24/19 (!) 160/100   2. Vitamin D deficiency Stacy Stafford has a history of Vitamin D deficiency with resultant generalized fatigue as her primary symptom.   she is taking vitamin D 50,000 IU weekly for this deficiency and tolerating it well without side-effect.  Most recent Vitamin D lab reviewed-  level: 8.5 on 10/10/2019.  Plan:   - Discussed importance of vitamin D (as well as calcium) to their health and wellbeing.   - We reviewed possible symptoms of low Vitamin D including low energy, depressed mood, muscle aches, joint aches, osteoporosis, etc.  - We discussed that low Vitamin D levels may be linked to an increased risk of cardiovascular events, and even increased risk of cancers, such as colon and breast.   - Educated pt that weight loss will likely improve availability of vitamin D, thus encouraged Stacy Stafford to continue with meal plan and their weight loss efforts to further improve this condition.  - I recommend pt take weekly prescription vit D 50,000 IU, which pt agrees to after discussion of the risks and benefits of this medication.      - Informed patient this may be a lifelong thing, and she was encouraged to continue to take the medicine until told otherwise.  We will need to monitor levels regularly (every 3-4 mo on average) to keep levels within normal limits.   - All pt's questions and concerns regarding this condition addressed.  3. Class 3 severe obesity with serious comorbidity and body mass index (BMI) of 40.0 to 44.9 in adult, unspecified obesity type (HCC)  Stacy Stafford is currently in the action stage of change.  As such, her goal is to continue with weight loss efforts. She has agreed to the Category 3 Plan.   Exercise goals: All adults should avoid inactivity. Some physical activity is better than none, and adults who participate in any amount of physical activity gain some health benefits.  Behavioral modification strategies: no skipping meals, meal planning and cooking strategies, keeping healthy foods in the home, better snacking choices and planning for success.  Stacy Stafford has agreed to follow-up with our clinic in 2-3  weeks. She was informed of the importance of frequent follow-up visits to maximize her success with intensive lifestyle modifications for her multiple health conditions.   Objective:   Blood pressure (!) 182/98, pulse (!) 101, temperature 98.6 F (37 C), height 5\' 10"  (1.778 m), weight (!) 304 lb (137.9 kg), SpO2 99 %. Body mass index is 43.62 kg/m.  General: Cooperative, alert, well developed, in no acute distress. HEENT: Conjunctivae and lids unremarkable. Cardiovascular: Regular rhythm.  Lungs: Normal work of breathing. Neurologic: No focal deficits.   Lab Results  Component Value Date   CREATININE 0.92 10/10/2019   BUN 15 10/10/2019   NA 139 10/10/2019   K 4.1 10/10/2019   CL 104 10/10/2019   CO2 22 10/10/2019   Lab Results  Component Value Date   ALT 18 10/10/2019   AST 17 10/10/2019   ALKPHOS 69 10/10/2019   BILITOT 0.3 10/10/2019   Lab Results  Component Value Date   HGBA1C 5.2 10/10/2019   Lab Results  Component Value Date   INSULIN 19.2 10/24/2019   Lab Results  Component Value Date   TSH 1.120 10/10/2019   Lab Results  Component Value Date   CHOL 208 (H) 10/10/2019   HDL 37 (L) 10/10/2019   LDLCALC 157 (H) 10/10/2019   LDLDIRECT 153 (H) 10/10/2019   TRIG 75 10/10/2019   Lab Results  Component Value Date   WBC 7.4 10/10/2019   HGB 15.0 10/10/2019   HCT 46.2 10/10/2019   MCV 91 10/10/2019   PLT 231 10/10/2019   Attestation Statements:   Reviewed by clinician on day of visit: allergies, medications, problem list, medical history, surgical history, family history, social history, and previous encounter notes.  Time spent on visit including pre-visit chart review and post-visit care and charting was 18 minutes.   I, 12/10/2019, CMA, am acting as Insurance claims handler for Energy manager, DO.  I have reviewed the above documentation for accuracy and completeness, and I agree with the above. [employer], D.O.  The 21st Century Cures Act was  signed into law in 2016 which includes the topic of electronic health records.  This provides immediate access to information in MyChart.  This includes consultation notes, operative notes, office notes, lab results and pathology reports.  If you have any questions about what you read please let 2017 know at your next visit so we can discuss your concerns and take corrective action if need be.  We are right here with you.

## 2019-12-05 ENCOUNTER — Encounter (HOSPITAL_BASED_OUTPATIENT_CLINIC_OR_DEPARTMENT_OTHER): Payer: Self-pay

## 2019-12-05 DIAGNOSIS — G4733 Obstructive sleep apnea (adult) (pediatric): Secondary | ICD-10-CM

## 2019-12-07 ENCOUNTER — Encounter (INDEPENDENT_AMBULATORY_CARE_PROVIDER_SITE_OTHER): Payer: Self-pay

## 2019-12-09 ENCOUNTER — Other Ambulatory Visit (INDEPENDENT_AMBULATORY_CARE_PROVIDER_SITE_OTHER): Payer: Self-pay | Admitting: Family Medicine

## 2019-12-09 DIAGNOSIS — G629 Polyneuropathy, unspecified: Secondary | ICD-10-CM

## 2019-12-10 ENCOUNTER — Other Ambulatory Visit (INDEPENDENT_AMBULATORY_CARE_PROVIDER_SITE_OTHER): Payer: Self-pay | Admitting: Family Medicine

## 2019-12-10 DIAGNOSIS — G629 Polyneuropathy, unspecified: Secondary | ICD-10-CM

## 2019-12-11 ENCOUNTER — Other Ambulatory Visit (INDEPENDENT_AMBULATORY_CARE_PROVIDER_SITE_OTHER): Payer: Self-pay | Admitting: Family Medicine

## 2019-12-11 DIAGNOSIS — G629 Polyneuropathy, unspecified: Secondary | ICD-10-CM

## 2019-12-12 ENCOUNTER — Other Ambulatory Visit (INDEPENDENT_AMBULATORY_CARE_PROVIDER_SITE_OTHER): Payer: Self-pay | Admitting: Family Medicine

## 2019-12-12 DIAGNOSIS — G629 Polyneuropathy, unspecified: Secondary | ICD-10-CM

## 2019-12-13 ENCOUNTER — Other Ambulatory Visit: Payer: Self-pay

## 2019-12-13 ENCOUNTER — Ambulatory Visit
Admission: RE | Admit: 2019-12-13 | Discharge: 2019-12-13 | Disposition: A | Discharge status: Home / Self Care | Payer: 59 | Source: Ambulatory Visit | Attending: Physical Medicine and Rehabilitation | Admitting: Physical Medicine and Rehabilitation

## 2019-12-13 ENCOUNTER — Other Ambulatory Visit (INDEPENDENT_AMBULATORY_CARE_PROVIDER_SITE_OTHER): Payer: Self-pay | Admitting: Family Medicine

## 2019-12-13 ENCOUNTER — Ambulatory Visit (INDEPENDENT_AMBULATORY_CARE_PROVIDER_SITE_OTHER): Payer: 59 | Admitting: Family Medicine

## 2019-12-13 DIAGNOSIS — G8929 Other chronic pain: Secondary | ICD-10-CM

## 2019-12-13 DIAGNOSIS — G629 Polyneuropathy, unspecified: Secondary | ICD-10-CM

## 2019-12-13 DIAGNOSIS — M545 Low back pain, unspecified: Secondary | ICD-10-CM

## 2019-12-13 MED ORDER — METHYLPREDNISOLONE ACETATE 40 MG/ML INJ SUSP (RADIOLOG
120.0000 mg | Freq: Once | INTRAMUSCULAR | Status: AC
  Administered 2019-12-13: 120 mg via EPIDURAL

## 2019-12-13 MED ORDER — IOPAMIDOL (ISOVUE-M 200) INJECTION 41%
1.0000 mL | Freq: Once | INTRAMUSCULAR | Status: AC
  Administered 2019-12-13: 1 mL via EPIDURAL

## 2019-12-13 NOTE — Discharge Instructions (Signed)

## 2019-12-14 ENCOUNTER — Other Ambulatory Visit (INDEPENDENT_AMBULATORY_CARE_PROVIDER_SITE_OTHER): Payer: Self-pay | Admitting: Family Medicine

## 2019-12-14 DIAGNOSIS — G629 Polyneuropathy, unspecified: Secondary | ICD-10-CM

## 2019-12-15 ENCOUNTER — Other Ambulatory Visit (INDEPENDENT_AMBULATORY_CARE_PROVIDER_SITE_OTHER): Payer: Self-pay | Admitting: Family Medicine

## 2019-12-15 DIAGNOSIS — G629 Polyneuropathy, unspecified: Secondary | ICD-10-CM

## 2019-12-16 ENCOUNTER — Other Ambulatory Visit (INDEPENDENT_AMBULATORY_CARE_PROVIDER_SITE_OTHER): Payer: Self-pay | Admitting: Family Medicine

## 2019-12-16 DIAGNOSIS — G629 Polyneuropathy, unspecified: Secondary | ICD-10-CM

## 2019-12-17 ENCOUNTER — Other Ambulatory Visit (INDEPENDENT_AMBULATORY_CARE_PROVIDER_SITE_OTHER): Payer: Self-pay | Admitting: Family Medicine

## 2019-12-17 DIAGNOSIS — G629 Polyneuropathy, unspecified: Secondary | ICD-10-CM

## 2019-12-18 ENCOUNTER — Other Ambulatory Visit (INDEPENDENT_AMBULATORY_CARE_PROVIDER_SITE_OTHER): Payer: Self-pay | Admitting: Family Medicine

## 2019-12-18 DIAGNOSIS — G629 Polyneuropathy, unspecified: Secondary | ICD-10-CM

## 2019-12-19 ENCOUNTER — Other Ambulatory Visit (INDEPENDENT_AMBULATORY_CARE_PROVIDER_SITE_OTHER): Payer: Self-pay | Admitting: Family Medicine

## 2019-12-19 DIAGNOSIS — G629 Polyneuropathy, unspecified: Secondary | ICD-10-CM

## 2019-12-20 ENCOUNTER — Other Ambulatory Visit (INDEPENDENT_AMBULATORY_CARE_PROVIDER_SITE_OTHER): Payer: Self-pay | Admitting: Family Medicine

## 2019-12-20 DIAGNOSIS — G629 Polyneuropathy, unspecified: Secondary | ICD-10-CM

## 2020-01-03 ENCOUNTER — Other Ambulatory Visit (INDEPENDENT_AMBULATORY_CARE_PROVIDER_SITE_OTHER): Payer: Self-pay | Admitting: Family Medicine

## 2020-01-03 DIAGNOSIS — G629 Polyneuropathy, unspecified: Secondary | ICD-10-CM

## 2020-01-03 NOTE — Telephone Encounter (Signed)
This patient was last seen by Dr. Delight Stare, and currently does not have an existing appointment with her.

## 2020-01-10 ENCOUNTER — Other Ambulatory Visit (INDEPENDENT_AMBULATORY_CARE_PROVIDER_SITE_OTHER): Payer: Self-pay | Admitting: Family Medicine

## 2020-01-10 DIAGNOSIS — G629 Polyneuropathy, unspecified: Secondary | ICD-10-CM

## 2020-01-11 ENCOUNTER — Encounter (HOSPITAL_BASED_OUTPATIENT_CLINIC_OR_DEPARTMENT_OTHER): Payer: Self-pay | Admitting: Internal Medicine

## 2020-01-14 ENCOUNTER — Ambulatory Visit (HOSPITAL_BASED_OUTPATIENT_CLINIC_OR_DEPARTMENT_OTHER): Payer: 59 | Attending: Internal Medicine | Admitting: Internal Medicine

## 2020-01-29 ENCOUNTER — Emergency Department (HOSPITAL_COMMUNITY): Payer: 59

## 2020-01-29 ENCOUNTER — Encounter (HOSPITAL_COMMUNITY): Payer: Self-pay

## 2020-01-29 ENCOUNTER — Emergency Department (HOSPITAL_COMMUNITY)
Admission: EM | Admit: 2020-01-29 | Discharge: 2020-01-29 | Disposition: A | Discharge status: Home / Self Care | Payer: 59 | Attending: Emergency Medicine | Admitting: Emergency Medicine

## 2020-01-29 DIAGNOSIS — M5441 Lumbago with sciatica, right side: Secondary | ICD-10-CM | POA: Insufficient documentation

## 2020-01-29 DIAGNOSIS — J45909 Unspecified asthma, uncomplicated: Secondary | ICD-10-CM | POA: Insufficient documentation

## 2020-01-29 DIAGNOSIS — F17210 Nicotine dependence, cigarettes, uncomplicated: Secondary | ICD-10-CM | POA: Insufficient documentation

## 2020-01-29 DIAGNOSIS — I10 Essential (primary) hypertension: Secondary | ICD-10-CM | POA: Insufficient documentation

## 2020-01-29 DIAGNOSIS — Z79899 Other long term (current) drug therapy: Secondary | ICD-10-CM | POA: Diagnosis not present

## 2020-01-29 DIAGNOSIS — R000 Tachycardia, unspecified: Secondary | ICD-10-CM | POA: Insufficient documentation

## 2020-01-29 DIAGNOSIS — M5431 Sciatica, right side: Secondary | ICD-10-CM

## 2020-01-29 DIAGNOSIS — M79604 Pain in right leg: Secondary | ICD-10-CM | POA: Diagnosis present

## 2020-01-29 LAB — BASIC METABOLIC PANEL
Anion gap: 7 (ref 5–15)
BUN: 16 mg/dL (ref 6–20)
CO2: 26 mmol/L (ref 22–32)
Calcium: 9.4 mg/dL (ref 8.9–10.3)
Chloride: 106 mmol/L (ref 98–111)
Creatinine, Ser: 0.76 mg/dL (ref 0.44–1.00)
GFR, Estimated: >60 mL/min (ref >60)
Glucose, Bld: 104 mg/dL — ABNORMAL HIGH (ref 70–99)
Potassium: 3.5 mmol/L (ref 3.5–5.1)
Sodium: 139 mmol/L (ref 135–145)

## 2020-01-29 LAB — CBC
HCT: 44.4 % (ref 36.0–46.0)
Hemoglobin: 15.1 g/dL — ABNORMAL HIGH (ref 12.0–15.0)
MCH: 29.8 pg (ref 26.0–34.0)
MCHC: 34 g/dL (ref 30.0–36.0)
MCV: 87.7 fL (ref 80.0–100.0)
Platelets: 266 10*3/uL (ref 150–400)
RBC: 5.06 MIL/uL (ref 3.87–5.11)
RDW: 12.4 % (ref 11.5–15.5)
WBC: 12.7 10*3/uL — ABNORMAL HIGH (ref 4.0–10.5)
nRBC: 0 % (ref 0.0–0.2)

## 2020-01-29 MED ORDER — HYDROMORPHONE HCL 1 MG/ML IJ SOLN
1.0000 mg | INTRAMUSCULAR | Status: AC | PRN
  Administered 2020-01-29 (×2): 1 mg via INTRAVENOUS
  Filled 2020-01-29 (×2): qty 1

## 2020-01-29 MED ORDER — AMLODIPINE BESYLATE 5 MG PO TABS: 5.0000 mg | ORAL_TABLET | Freq: Once | ORAL | Status: DC

## 2020-01-29 MED ORDER — SODIUM CHLORIDE 0.9 % IV SOLN: 1000.0000 mL | INTRAVENOUS | Status: DC

## 2020-01-29 MED ORDER — HYDROCODONE-ACETAMINOPHEN 5-325 MG PO TABS
1.0000 | ORAL_TABLET | Freq: Four times a day (QID) | ORAL | 0 refills | Status: AC | PRN
Start: 2020-01-29 — End: ?

## 2020-01-29 MED ORDER — PREDNISONE 50 MG PO TABS: 50.0000 mg | ORAL_TABLET | Freq: Every day | ORAL | 0 refills | Status: AC

## 2020-01-29 MED ORDER — SODIUM CHLORIDE 0.9 % IV BOLUS (SEPSIS)
1000.0000 mL | Freq: Once | INTRAVENOUS | Status: AC
  Administered 2020-01-29: 15:00:00 1000 mL via INTRAVENOUS

## 2020-01-29 NOTE — Discharge Instructions (Signed)
Take the medications as prescribed.  Do not take the tramadol while taking the hydrocodone.  Follow-up with your back doctor to discuss further treatment.

## 2020-01-29 NOTE — ED Triage Notes (Signed)
Pt presents via EMS from home with c/o right leg pain. Pt reports the pain started last Monday and is in her thigh and her calf. Pt reports she has been in a laying position for most of the time since last Monday as that is the only position of relief. Pt does have a bulging disc on the left side of her back since August, feels pretty similar that the pain she had in her leg after that.

## 2020-01-29 NOTE — ED Provider Notes (Signed)
[facility]-EMERGENCY DEPT Provider Note   CSN: [id number] Arrival date & time: 01/29/20  1232     History Chief Complaint  Patient presents with  . Leg Pain    Stacy Stafford is a 34 y.o. female.  HPI   Patient presents to the ED for evaluation of right leg pain.  Patient states she has a history of degenerative disc disease.  She had been seeing Dr. Yevette Edwards.  Patient had an MRI and has been receiving steroid injections.  Patient states her symptoms were primarily on the left side and she was noting some improvement.  However this last week she was walking to the car when she suddenly started having more severe pain on the right side.  Ever since that time the pain has progressed.  She has been unable to move around a lot and has been mostly lying in the bed.  Patient came in today because of the more severe pain.  She denies any fevers or chills.  No vomiting or diarrhea.  Past Medical History:  Diagnosis Date  . Anxiety   . Arthritis   . Asthma   . Asthma   . Back pain   . Chest pain   . Depression   . Hypertension   . Joint pain   . Morbid obesity (HCC)   . Shortness of breath on exertion   . Vitamin D deficiency     Patient Active Problem List   Diagnosis Date Noted  . Essential hypertension 11/29/2019  . Neuropathy 11/12/2019  . Vitamin D deficiency 10/10/2019  . Depression 10/10/2019  . H/O noncompliance with medical treatment, presenting hazards to health 10/10/2019  . History of seizure disorder 10/10/2019  . HTN, goal below 130/80 10/10/2019  . Morbid obesity (HCC) 10/10/2019    Past Surgical History:  Procedure Laterality Date  . none       OB History    Gravida  0   Para  0   Term  0   Preterm  0   AB  0   Living  0     SAB  0   IAB  0   Ectopic  0   Multiple  0   Live Births  0           Family History  Problem Relation Age of Onset  . Diabetes Mother   . Hypertension Mother   . Schizophrenia  Mother   . Bipolar disorder Mother   . Anxiety disorder Mother   . Depression Mother   . Alcoholism Mother   . Drug abuse Mother   . Cancer Father   . Bipolar disorder Father   . Anxiety disorder Father   . Alcoholism Father   . Drug abuse Father     Social History   Tobacco Use  . Smoking status: Current Every Day Smoker    Packs/day: 0.50    Types: Cigarettes  . Smokeless tobacco: Never Used  Substance Use Topics  . Alcohol use: Yes    Alcohol/week: 1.0 standard drink    Types: 1 Glasses of wine per week  . Drug use: Never    Home Medications Prior to Admission medications   Medication Sig Start Date End Date Taking? Authorizing Provider  HYDROcodone-acetaminophen (NORCO/VICODIN) 5-325 MG tablet Take 1 tablet by mouth every 6 (six) hours as needed. 01/29/20  Yes Linwood Dibbles, MD  predniSONE (DELTASONE) 50 MG tablet Take 1 tablet (50 mg total) by mouth daily. 01/29/20  Yes Linwood Dibbles, MD  amLODipine-benazepril (LOTREL) 5-20 MG capsule Take 1 capsule by mouth daily.    [provider]  cyclobenzaprine (FLEXERIL) 5 MG tablet Take 5 mg by mouth 3 (three) times daily as needed for muscle spasms.     [provider]  DULoxetine (CYMBALTA) 20 MG capsule Take 20 mg by mouth 2 (two) times daily.    [provider]  lidocaine (LIDODERM) 5 % Place 1 patch onto the skin daily. Remove & Discard patch within 12 hours or as directed by MD     [provider]  meloxicam (MOBIC) 15 MG tablet Take 15 mg by mouth daily.     [provider]  methocarbamol (ROBAXIN) 500 MG tablet Take 1 tablet (500 mg total) by mouth every 8 (eight) hours as needed for muscle spasms. 12/09/18   Benjiman Core, MD  traMADol (ULTRAM) 50 MG tablet Take by mouth every 6 (six) hours as needed.     [provider]  Vitamin D, Ergocalciferol, (DRISDOL) 1.25 MG (50000 UNIT) CAPS capsule Take one tablet wkly 11/07/19   Thomasene Lot, DO    Allergies    Patient  has no known allergies.  Review of Systems   Review of Systems  All other systems reviewed and are negative.   Physical Exam Updated Vital Signs BP (!) 167/114 (BP Location: Right Arm)   Pulse 98   Temp 98.8 F (37.1 C) (Oral)   Resp 18   LMP 01/15/2020 (Approximate)   SpO2 99%   Physical Exam Vitals and nursing note reviewed.  Constitutional:      General: She is not in acute distress.    Appearance: She is well-developed and well-nourished. She is obese.  HENT:     Head: Normocephalic and atraumatic.     Right Ear: External ear normal.     Left Ear: External ear normal.  Eyes:     General: No scleral icterus.       Right eye: No discharge.        Left eye: No discharge.     Conjunctiva/sclera: Conjunctivae normal.  Neck:     Trachea: No tracheal deviation.  Cardiovascular:     Rate and Rhythm: Regular rhythm. Tachycardia present.     Pulses: Intact distal pulses.  Pulmonary:     Effort: Pulmonary effort is normal. No respiratory distress.     Breath sounds: Normal breath sounds. No stridor. No wheezing or rales.  Abdominal:     General: Bowel sounds are normal. There is no distension.     Palpations: Abdomen is soft.     Tenderness: There is no abdominal tenderness. There is no guarding or rebound.     Comments: Erythematous rash noted in the skin fold of the periumbilical regionn  Musculoskeletal:        General: No tenderness or edema.     Cervical back: Neck supple.     Comments: Tenderness palpation lumbar spine and right hip, patient unable to move her leg without significant pain, no focal swelling or tenderness in the lower leg extremities are warm and well perfused  Skin:    General: Skin is warm and dry.     Findings: No rash.  Neurological:     Mental Status: She is alert.     Cranial Nerves: No cranial nerve deficit (no facial droop, extraocular movements intact, no slurred speech).     Sensory: No sensory deficit.     Motor: No abnormal muscle tone  or seizure activity.     Coordination: Coordination normal.     Deep Tendon Reflexes: Strength normal.  Psychiatric:        Mood and Affect: Mood and affect normal.     ED Results / Procedures / Treatments   Labs (all labs ordered are listed, but only abnormal results are displayed) Labs Reviewed  CBC - Abnormal; Notable for the following components:      Result Value   WBC 12.7 (*)    Hemoglobin 15.1 (*)    All other components within normal limits  BASIC METABOLIC PANEL - Abnormal; Notable for the following components:   Glucose, Bld 104 (*)    All other components within normal limits    EKG None  Radiology DG Lumbar Spine Complete  Result Date: 01/29/2020 CLINICAL DATA:  Pain following recent fall EXAM: LUMBAR SPINE - COMPLETE 4+ VIEW COMPARISON:  Lumbar radiographs December 09, 2018; lumbar MRI October 20, 2019 FINDINGS: Frontal, lateral, spot lumbosacral lateral, and bilateral oblique views were obtained. There are 5 non-rib-bearing lumbar type vertebral bodies. There is no fracture or spondylolisthesis. Moderately severe disc space narrowing at L4-5 and L5-S1 remains stable. Other disc spaces appear unremarkable. There is facet osteoarthritic change at L4-5 and L5-S1 bilaterally. Intrauterine device in mid pelvis. IMPRESSION: Osteoarthritic change at L4-5 and L5-S1 again noted, similar to prior study. No fracture or spondylolisthesis. Intrauterine device in mid pelvis. Electronically Signed   By: Bretta Bang III M.D.   On: 01/29/2020 14:17   DG Hip Unilat W or Wo Pelvis 2-3 Views Right  Result Date: 01/29/2020 CLINICAL DATA:  Pain following recent fall EXAM: DG HIP (WITH OR WITHOUT PELVIS) 2-3V RIGHT COMPARISON:  None. FINDINGS: Frontal pelvis as well as frontal and lateral right hip images were obtained. No fracture or dislocation. There is mild symmetric narrowing of each hip joint. No erosive change. Sacroiliac joints appear normal bilaterally. There is an  intrauterine device in the mid-pelvis. IMPRESSION: Slight symmetric narrowing of each hip joint. No fracture or dislocation. Intrauterine device in the pelvis. Electronically Signed   By: Bretta Bang III M.D.   On: 01/29/2020 14:16    Procedures Procedures (including critical care time)  Medications Ordered in ED Medications  sodium chloride 0.9 % bolus 1,000 mL (0 mLs Intravenous Stopped 01/29/20 1602)    Followed by  0.9 %  sodium chloride infusion (has no administration in time range)  amLODipine (NORVASC) tablet 5 mg (has no administration in time range)  HYDROmorphone (DILAUDID) injection 1 mg (1 mg Intravenous Given 01/29/20 1616)    ED Course  I have reviewed the triage vital signs and the nursing notes.  Pertinent labs & imaging results that were available during my care of the patient were reviewed by me and considered in my medical decision making (see chart for details).  Clinical Course as of 01/29/20 1620  Mon Jan 29, 2020  1518 No acute findings noted on lumbar spine or hip x-rays [JK]    Clinical Course User Index [JK] Linwood Dibbles, MD   MDM Rules/Calculators/A&P                          Patient presented to the ED for evaluation of acute back pain.  Patient's seems to be radicular in nature.  Patient did have an MRI of her lumbar spine in September of this year.  At that time she was having left-sided pain.  Patient ended up having  an epidural.  Her MRI did show a moderate L4 and L5 and mild L3-L4 L5-S1 spinal canal narrowing patient also had bilateral neuroforaminal narrowing at L3-S1 levels.  Patient does not have any acute nerve deficit.  Laboratory tests show an elevated white blood cell count but I doubt acute infection.  Patient was hypertensive and encouraged to follow-up with her primary care doctor.  She was given a dose of Norvasc.  Will discharge home with a course of steroids and hydrocodone. Final Clinical Impression(s) / ED Diagnoses Final diagnoses:   Sciatica of right side  Hypertension, unspecified type    Rx / DC Orders ED Discharge Orders         Ordered    HYDROcodone-acetaminophen (NORCO/VICODIN) 5-325 MG tablet  Every 6 hours PRN        01/29/20 1616    predniSONE (DELTASONE) 50 MG tablet  Daily        01/29/20 1616           Linwood DibblesKnapp, Makinzy Cleere, MD 01/29/20 1620

## 2021-06-17 IMAGING — CR DG LUMBAR SPINE COMPLETE 4+V
5 series · 5 of 5 positions shown · non-contrast
Comparison: Lumbar radiographs December 09, 2018; lumbar MRI
October 20, 2019

CLINICAL DATA: Pain following recent fall

EXAM:
LUMBAR SPINE - COMPLETE 4+ VIEW

[t lumbar spine ap]
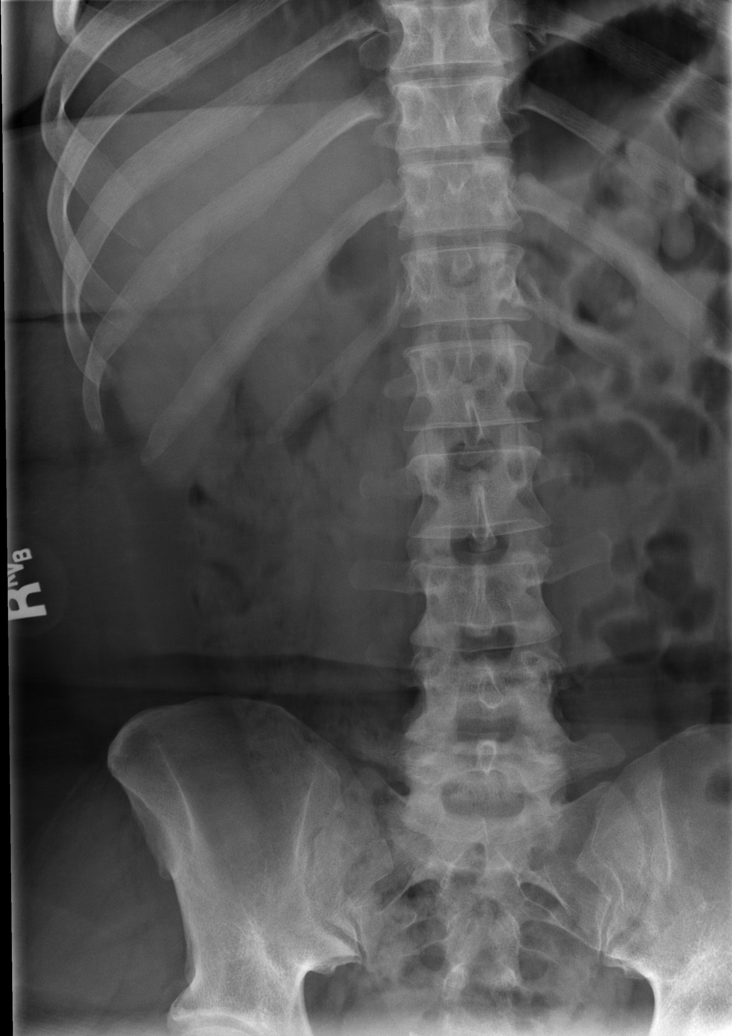

[t lumbar spine obl (1 of 2)]
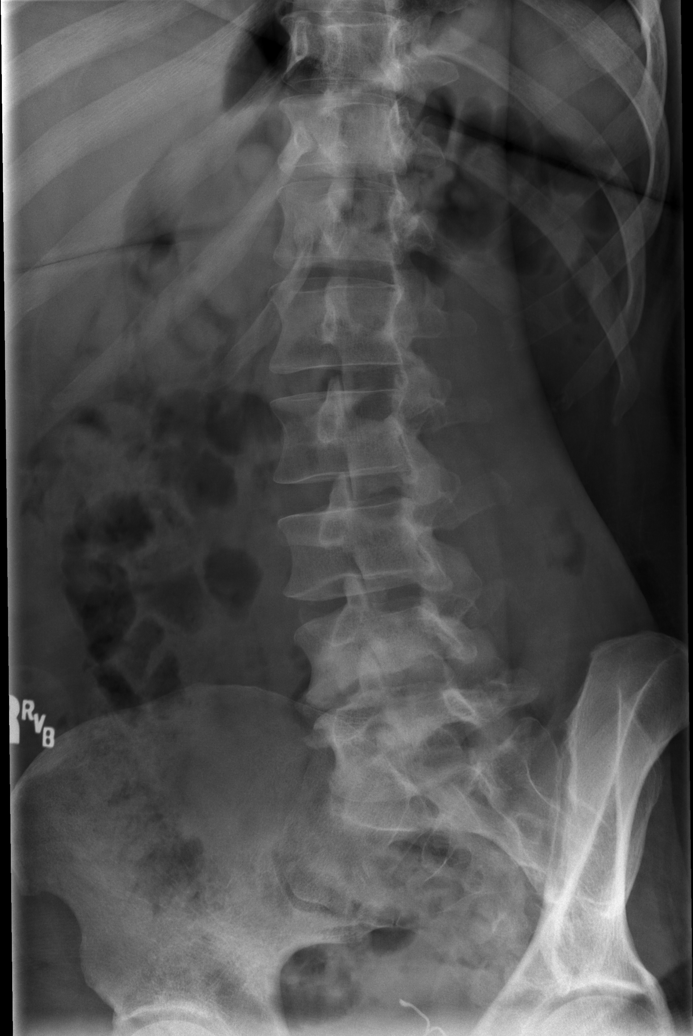

[t lumbar spine obl (2 of 2)]
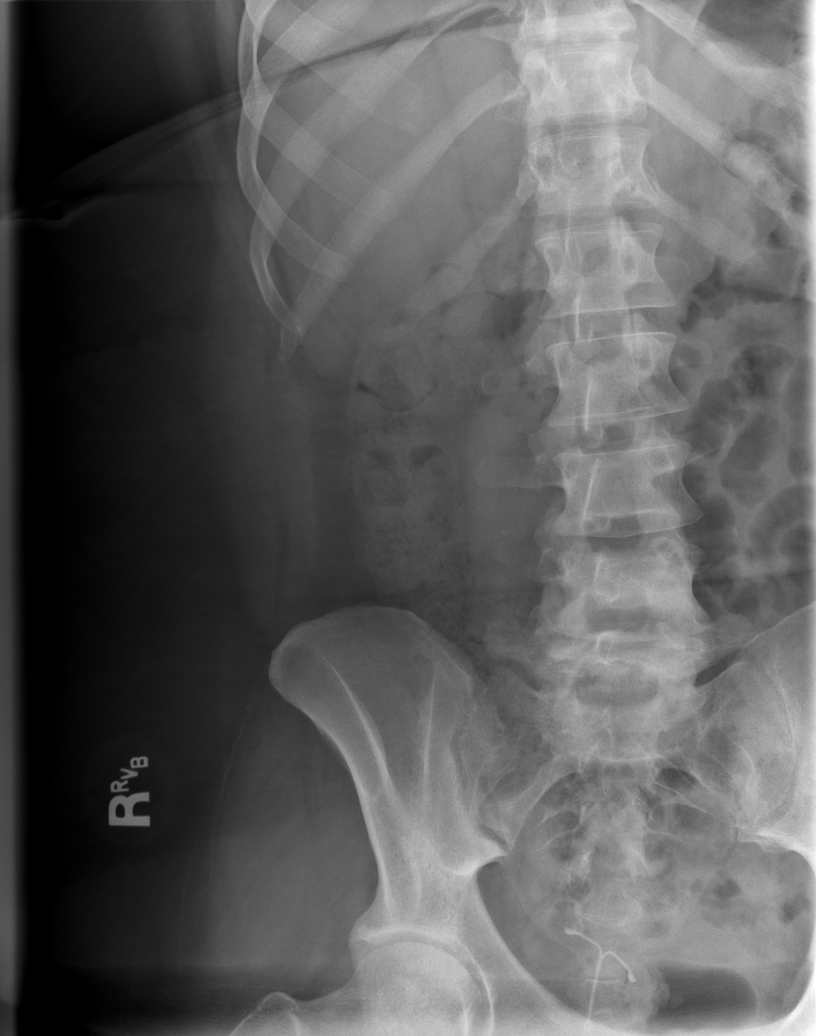

[t lumbar spine lat]
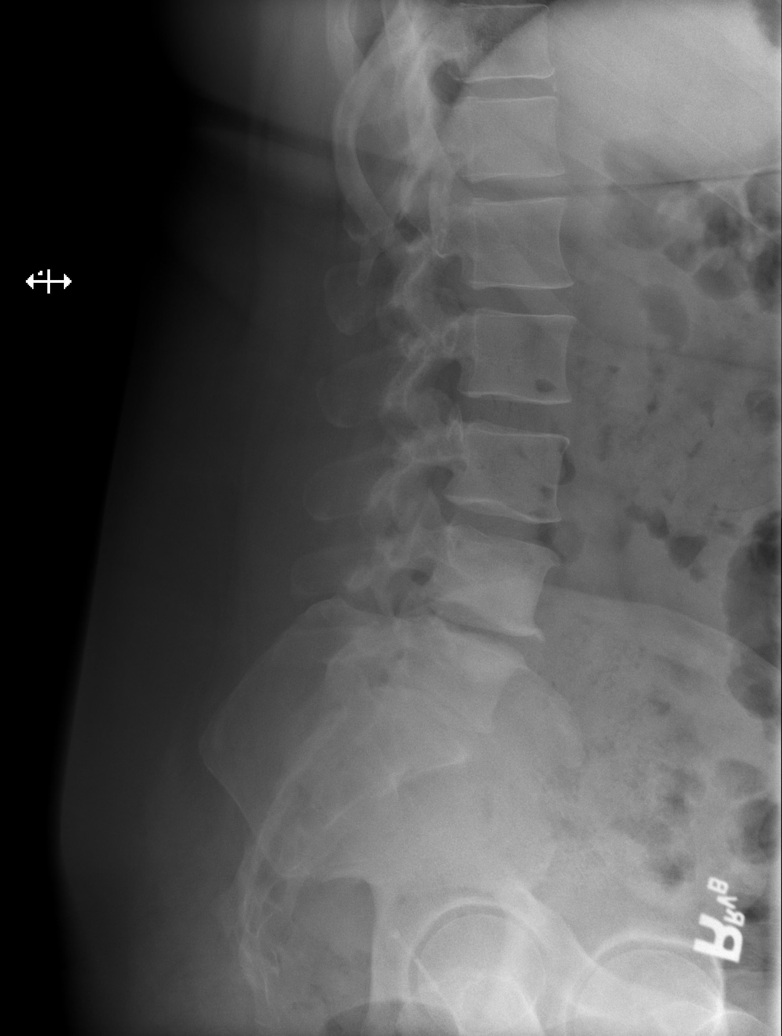

[t lumbar l-5 s-1 spot]
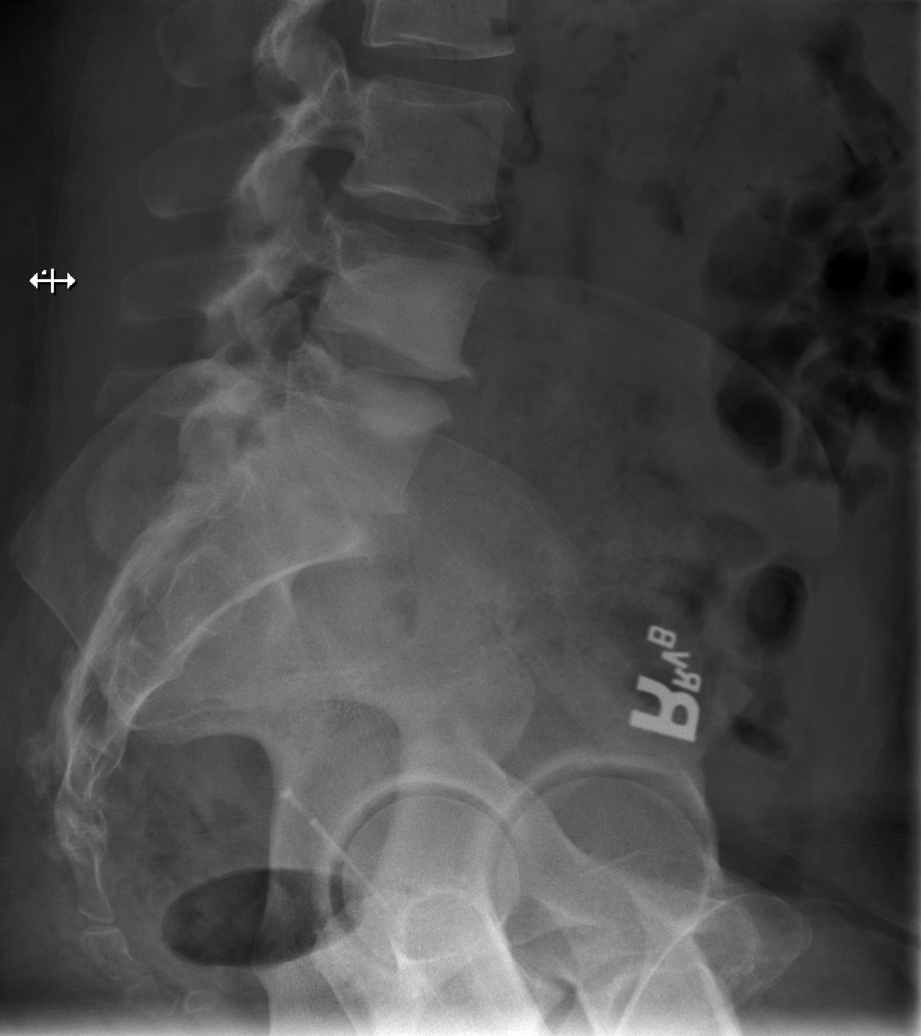

[5 of 5 positions shown; findings below may reference images not displayed]

FINDINGS: Frontal, lateral, spot lumbosacral lateral, and bilateral oblique
views were obtained. There are 5 non-rib-bearing lumbar type
vertebral bodies. There is no fracture or spondylolisthesis.
Moderately severe disc space narrowing at L4-5 and L5-S1 remains
stable. Other disc spaces appear unremarkable. There is facet
osteoarthritic change at L4-5 and L5-S1 bilaterally. Intrauterine
device in mid pelvis.
IMPRESSION: Osteoarthritic change at L4-5 and L5-S1 again noted, similar to
prior study. No fracture or spondylolisthesis. Intrauterine device
in mid pelvis.

## 2021-09-10 ENCOUNTER — Encounter (INDEPENDENT_AMBULATORY_CARE_PROVIDER_SITE_OTHER): Payer: Self-pay
